# Patient Record
Sex: Male | Born: 1960
Health system: Southern US, Community
[De-identification: ages and names within clinical notes are randomized; demographics above are authoritative.]

## PROBLEM LIST (undated history)

## (undated) DIAGNOSIS — F909 Attention-deficit hyperactivity disorder, unspecified type: Secondary | ICD-10-CM

## (undated) DIAGNOSIS — E785 Hyperlipidemia, unspecified: Secondary | ICD-10-CM

## (undated) DIAGNOSIS — I10 Essential (primary) hypertension: Secondary | ICD-10-CM

## (undated) DIAGNOSIS — M1712 Unilateral primary osteoarthritis, left knee: Secondary | ICD-10-CM

## (undated) DIAGNOSIS — T7840XA Allergy, unspecified, initial encounter: Secondary | ICD-10-CM

## (undated) DIAGNOSIS — Z87442 Personal history of urinary calculi: Secondary | ICD-10-CM

## (undated) DIAGNOSIS — C801 Malignant (primary) neoplasm, unspecified: Secondary | ICD-10-CM

## (undated) DIAGNOSIS — M1990 Unspecified osteoarthritis, unspecified site: Secondary | ICD-10-CM

## (undated) HISTORY — PX: CYST REMOVAL NECK: SHX6281

## (undated) HISTORY — PX: POLYPECTOMY: SHX149

## (undated) HISTORY — DX: Hyperlipidemia, unspecified: E78.5

## (undated) HISTORY — DX: Attention-deficit hyperactivity disorder, unspecified type: F90.9

## (undated) HISTORY — PX: COLONOSCOPY: SHX174

## (undated) HISTORY — DX: Allergy, unspecified, initial encounter: T78.40XA

## (undated) HISTORY — DX: Essential (primary) hypertension: I10

---

## 1985-09-03 HISTORY — PX: OTHER SURGICAL HISTORY: SHX169

## 2004-02-12 ENCOUNTER — Emergency Department (HOSPITAL_COMMUNITY): Admission: EM | Admit: 2004-02-12 | Discharge: 2004-02-12 | Payer: Self-pay | Admitting: Emergency Medicine

## 2004-09-06 ENCOUNTER — Emergency Department (HOSPITAL_COMMUNITY): Admission: EM | Admit: 2004-09-06 | Discharge: 2004-09-06 | Payer: Self-pay | Admitting: Emergency Medicine

## 2008-10-22 ENCOUNTER — Emergency Department (HOSPITAL_COMMUNITY): Admission: EM | Admit: 2008-10-22 | Discharge: 2008-10-23 | Payer: Self-pay | Admitting: Emergency Medicine

## 2008-10-26 ENCOUNTER — Encounter (INDEPENDENT_AMBULATORY_CARE_PROVIDER_SITE_OTHER): Payer: Self-pay | Admitting: *Deleted

## 2008-12-13 ENCOUNTER — Ambulatory Visit: Payer: Self-pay | Admitting: Internal Medicine

## 2008-12-23 ENCOUNTER — Encounter: Payer: Self-pay | Admitting: Internal Medicine

## 2008-12-23 ENCOUNTER — Ambulatory Visit: Payer: Self-pay | Admitting: Internal Medicine

## 2008-12-27 ENCOUNTER — Encounter: Payer: Self-pay | Admitting: Internal Medicine

## 2010-10-03 NOTE — Letter (Signed)
Summary: Pre Visit No Show Letter  [facility]  [address] [place], [place] [address]   Phone: [phone]  Fax: [phone]        October 26, 2008 MRN: [id number]    Bradley Baker [address] [place], [place]  [address]    Dear Mr. Vary,   We have been unable to reach you by phone concerning the pre-procedure visit that you missed on 10-26-08. For this reason,your procedure scheduled on 11-09-08 has been cancelled. Our scheduling staff will gladly assist you with rescheduling your appointments at a more convenient time. Please call our office at [phone] between the hours of 8:00am and 5:00pm, press option #2 to reach an appointment scheduler. Please consider updating your contact numbers at this time so that we can reach you by phone in the future with schedule changes or results.    Thank you,    Ezra Sites RN [facility]

## 2010-12-19 LAB — CBC
HCT: 43.8 % (ref 39.0–52.0)
Hemoglobin: 14.9 g/dL (ref 13.0–17.0)
MCHC: 34 g/dL (ref 30.0–36.0)
MCV: 88.7 fL (ref 78.0–100.0)
RBC: 4.93 MIL/uL (ref 4.22–5.81)
WBC: 11.8 10*3/uL — ABNORMAL HIGH (ref 4.0–10.5)

## 2010-12-19 LAB — URINALYSIS, ROUTINE W REFLEX MICROSCOPIC
Bilirubin Urine: NEGATIVE
Glucose, UA: NEGATIVE mg/dL
Protein, ur: NEGATIVE mg/dL

## 2010-12-19 LAB — BASIC METABOLIC PANEL
CO2: 24 mEq/L (ref 19–32)
Chloride: 103 mEq/L (ref 96–112)
Creatinine, Ser: 0.91 mg/dL (ref 0.4–1.5)
GFR calc Af Amer: >60 mL/min (ref >60)
Potassium: 3.6 mEq/L (ref 3.5–5.1)
Sodium: 137 mEq/L (ref 135–145)

## 2010-12-19 LAB — URINE CULTURE

## 2010-12-19 LAB — URINE MICROSCOPIC-ADD ON

## 2010-12-19 LAB — DIFFERENTIAL
Eosinophils Absolute: 0.1 10*3/uL (ref 0.0–0.7)
Lymphs Abs: 1 10*3/uL (ref 0.7–4.0)
Monocytes Absolute: 0.2 10*3/uL (ref 0.1–1.0)
Monocytes Relative: 2 % — ABNORMAL LOW (ref 3–12)
Neutrophils Relative %: 89 % — ABNORMAL HIGH (ref 43–77)

## 2011-12-18 ENCOUNTER — Encounter: Payer: Self-pay | Admitting: Internal Medicine

## 2012-12-09 ENCOUNTER — Encounter: Payer: Self-pay | Admitting: Internal Medicine

## 2013-04-17 ENCOUNTER — Encounter: Payer: Self-pay | Admitting: Internal Medicine

## 2013-07-06 ENCOUNTER — Ambulatory Visit (AMBULATORY_SURGERY_CENTER): Payer: Self-pay

## 2013-07-06 VITALS — Ht 75.0 in | Wt 209.0 lb

## 2013-07-06 DIAGNOSIS — Z860100 Personal history of colon polyps, unspecified: Secondary | ICD-10-CM

## 2013-07-06 MED ORDER — MOVIPREP 100 G PO SOLR: 1.0000 | Freq: Once | ORAL | Status: DC

## 2013-07-07 ENCOUNTER — Encounter: Payer: Self-pay | Admitting: Internal Medicine

## 2013-07-09 ENCOUNTER — Encounter: Payer: Self-pay | Admitting: Internal Medicine

## 2013-07-09 ENCOUNTER — Ambulatory Visit (AMBULATORY_SURGERY_CENTER): Payer: BC Managed Care – PPO | Admitting: Internal Medicine

## 2013-07-09 VITALS — BP 147/89 | HR 65 | Temp 97.8°F | Resp 24 | Ht 75.0 in | Wt 209.0 lb

## 2013-07-09 DIAGNOSIS — Z86010 Personal history of colonic polyps: Secondary | ICD-10-CM

## 2013-07-09 DIAGNOSIS — D126 Benign neoplasm of colon, unspecified: Secondary | ICD-10-CM

## 2013-07-09 MED ORDER — SODIUM CHLORIDE 0.9 % IV SOLN: 500.0000 mL | INTRAVENOUS | Status: DC

## 2013-07-09 NOTE — Patient Instructions (Signed)
YOU HAD AN ENDOSCOPIC PROCEDURE TODAY AT THE [facility]: Refer to the procedure report that was given to you for any specific questions about what was found during the examination.  If the procedure report does not answer your questions, please call your gastroenterologist to clarify.  If you requested that your care partner not be given the details of your procedure findings, then the procedure report has been included in a sealed envelope for you to review at your convenience later.  YOU SHOULD EXPECT: Some feelings of bloating in the abdomen. Passage of more gas than usual.  Walking can help get rid of the air that was put into your GI tract during the procedure and reduce the bloating. If you had a lower endoscopy (such as a colonoscopy or flexible sigmoidoscopy) you may notice spotting of blood in your stool or on the toilet paper. If you underwent a bowel prep for your procedure, then you may not have a normal bowel movement for a few days.  DIET: Your first meal following the procedure should be a light meal and then it is ok to progress to your normal diet.  A half-sandwich or bowl of soup is an example of a good first meal.  Heavy or fried foods are harder to digest and may make you feel nauseous or bloated.  Likewise meals heavy in dairy and vegetables can cause extra gas to form and this can also increase the bloating.  Drink plenty of fluids but you should avoid alcoholic beverages for 24 hours.  ACTIVITY: Your care partner should take you home directly after the procedure.  You should plan to take it easy, moving slowly for the rest of the day.  You can resume normal activity the day after the procedure however you should NOT DRIVE or use heavy machinery for 24 hours (because of the sedation medicines used during the test).    SYMPTOMS TO REPORT IMMEDIATELY: A gastroenterologist can be reached at any hour.  During normal business hours, 8:30 AM to 5:00 PM Monday through Friday,  call [phone].  After hours and on weekends, please call the GI answering service at [phone] who will take a message and have the physician on call contact you.   Following lower endoscopy (colonoscopy or flexible sigmoidoscopy):  Excessive amounts of blood in the stool  Significant tenderness or worsening of abdominal pains  Swelling of the abdomen that is new, acute  Fever of 100F or higher   FOLLOW UP: If any biopsies were taken you will be contacted by phone or by letter within the next 1-3 weeks.  Call your gastroenterologist if you have not heard about the biopsies in 3 weeks.  Our staff will call the home number listed on your records the next business day following your procedure to check on you and address any questions or concerns that you may have at that time regarding the information given to you following your procedure. This is a courtesy call and so if there is no answer at the home number and we have not heard from you through the emergency physician on call, we will assume that you have returned to your regular daily activities without incident.  SIGNATURES/CONFIDENTIALITY: You and/or your care partner have signed paperwork which will be entered into your electronic medical record.  These signatures attest to the fact that that the information above on your After Visit Summary has been reviewed and is understood.  Full responsibility of the confidentiality of   this discharge information lies with you and/or your care-partner.  Polyp information given.  Next colonoscopy 5 years-2019 

## 2013-07-09 NOTE — Progress Notes (Signed)
Patient did not experience any of the following events: a burn prior to discharge; a fall within the facility; wrong site/side/patient/procedure/implant event; or a hospital transfer or hospital admission upon discharge from the facility. (G8907) Patient did not have preoperative order for IV antibiotic SSI prophylaxis. (G8918)  

## 2013-07-09 NOTE — Op Note (Signed)
[facility] [address] [place], [address]   COLONOSCOPY PROCEDURE REPORT  PATIENT: Bradley Baker, Bradley Baker  MR#: [id number] BIRTHDATE: 1961-05-27 , 52  yrs. old GENDER: Male ENDOSCOPIST: Roxy Cedar, MD REFERRED HQ:[id number] Program Recall PROCEDURE DATE:  07/09/2013 PROCEDURE:   Colonoscopy with snare polypectomy x 2 First Screening Colonoscopy - Avg.  risk and is 50 yrs.  old or older - No.  Prior Negative Screening - Now for repeat screening. N/A  History of Adenoma - Now for follow-up colonoscopy & has been > or = to 3 yrs.  Yes hx of adenoma.  Has been 3 or more years since last colonoscopy.  Polyps Removed Today? Yes. ASA CLASS:   Class II INDICATIONS:Patient's personal history of adenomatous colon polyps. Index April 2000 and with multiple advanced adenomas. MEDICATIONS: MAC sedation, administered by CRNA and propofol (Diprivan) 500mg  IV  DESCRIPTION OF PROCEDURE:   After the risks benefits and alternatives of the procedure were thoroughly explained, informed consent was obtained.  A digital rectal exam revealed no abnormalities of the rectum.   The LB [id number]-H190 [id number]  endoscope was introduced through the anus and advanced to the cecum, which was identified by both the appendix and ileocecal valve. No adverse events experienced.   The quality of the prep was excellent, using MoviPrep  The instrument was then slowly withdrawn as the colon was fully examined.  COLON FINDINGS: Two diminutive polyps were found in the transverse colon and sigmoid colon.  A polypectomy was performed with a cold snare.  The resection was complete and the polyp tissue was completely retrieved.   The colon mucosa was otherwise normal. Retroflexed views revealed internal hemorrhoids. The time to cecum=2 minutes 36 seconds.  Withdrawal time=14 minutes 12 seconds. The scope was withdrawn and the procedure completed. COMPLICATIONS: There were no complications.  ENDOSCOPIC  IMPRESSION: 1.   Two diminutive polyps were found in the colon; polypectomy was performed with a cold snare 2.   The colon mucosa was otherwise normal  RECOMMENDATIONS: 1. Follow up colonoscopy in 5 years   eSigned:  Roxy Cedar, MD 07/09/2013 9:56 AM   cc: Geoffry Paradise, MD and The Patient

## 2013-07-09 NOTE — Progress Notes (Signed)
Called to room to assist during endoscopic procedure.  Patient ID and intended procedure confirmed with present staff. Received instructions for my participation in the procedure from the performing physician. ewm 

## 2013-07-10 ENCOUNTER — Telehealth: Payer: Self-pay | Admitting: *Deleted

## 2013-07-10 NOTE — Telephone Encounter (Signed)
  Follow up Call-  Call back number 07/09/2013  Post procedure Call Back phone  # [phone]  Permission to leave phone message Yes     Patient questions:  Do you have a fever, pain , or abdominal swelling? no Pain Score  0 *  Have you tolerated food without any problems? yes  Have you been able to return to your normal activities? yes  Do you have any questions about your discharge instructions: Diet   no Medications  no Follow up visit  no  Do you have questions or concerns about your Care? no  Actions: * If pain score is 4 or above: No action needed, pain <4.

## 2013-07-15 ENCOUNTER — Encounter: Payer: Self-pay | Admitting: Internal Medicine

## 2016-12-17 DIAGNOSIS — Z125 Encounter for screening for malignant neoplasm of prostate: Secondary | ICD-10-CM | POA: Diagnosis not present

## 2016-12-17 DIAGNOSIS — E784 Other hyperlipidemia: Secondary | ICD-10-CM | POA: Diagnosis not present

## 2016-12-24 DIAGNOSIS — Z1389 Encounter for screening for other disorder: Secondary | ICD-10-CM | POA: Diagnosis not present

## 2016-12-24 DIAGNOSIS — I10 Essential (primary) hypertension: Secondary | ICD-10-CM | POA: Diagnosis not present

## 2016-12-24 DIAGNOSIS — M1990 Unspecified osteoarthritis, unspecified site: Secondary | ICD-10-CM | POA: Diagnosis not present

## 2016-12-24 DIAGNOSIS — J309 Allergic rhinitis, unspecified: Secondary | ICD-10-CM | POA: Diagnosis not present

## 2016-12-24 DIAGNOSIS — Z0000 Encounter for general adult medical examination without abnormal findings: Secondary | ICD-10-CM | POA: Diagnosis not present

## 2016-12-27 DIAGNOSIS — M659 Synovitis and tenosynovitis, unspecified: Secondary | ICD-10-CM | POA: Diagnosis not present

## 2016-12-27 DIAGNOSIS — M25532 Pain in left wrist: Secondary | ICD-10-CM | POA: Diagnosis not present

## 2017-01-24 DIAGNOSIS — M25532 Pain in left wrist: Secondary | ICD-10-CM | POA: Diagnosis not present

## 2017-01-24 DIAGNOSIS — M6588 Other synovitis and tenosynovitis, other site: Secondary | ICD-10-CM | POA: Diagnosis not present

## 2017-03-30 DIAGNOSIS — M1711 Unilateral primary osteoarthritis, right knee: Secondary | ICD-10-CM | POA: Diagnosis not present

## 2017-04-17 DIAGNOSIS — M1711 Unilateral primary osteoarthritis, right knee: Secondary | ICD-10-CM | POA: Diagnosis not present

## 2017-04-29 DIAGNOSIS — M1711 Unilateral primary osteoarthritis, right knee: Secondary | ICD-10-CM | POA: Diagnosis not present

## 2017-05-21 DIAGNOSIS — Z08 Encounter for follow-up examination after completed treatment for malignant neoplasm: Secondary | ICD-10-CM | POA: Diagnosis not present

## 2017-05-21 DIAGNOSIS — Z85820 Personal history of malignant melanoma of skin: Secondary | ICD-10-CM | POA: Diagnosis not present

## 2017-05-21 DIAGNOSIS — Z1283 Encounter for screening for malignant neoplasm of skin: Secondary | ICD-10-CM | POA: Diagnosis not present

## 2017-05-23 DIAGNOSIS — M659 Synovitis and tenosynovitis, unspecified: Secondary | ICD-10-CM | POA: Diagnosis not present

## 2017-06-06 DIAGNOSIS — M6588 Other synovitis and tenosynovitis, other site: Secondary | ICD-10-CM | POA: Diagnosis not present

## 2017-06-07 ENCOUNTER — Other Ambulatory Visit: Payer: Self-pay | Admitting: Orthopedic Surgery

## 2017-06-07 DIAGNOSIS — M659 Synovitis and tenosynovitis, unspecified: Secondary | ICD-10-CM

## 2017-07-04 HISTORY — PX: WRIST SURGERY: SHX841

## 2017-07-05 DIAGNOSIS — G8918 Other acute postprocedural pain: Secondary | ICD-10-CM | POA: Diagnosis not present

## 2017-07-05 DIAGNOSIS — M65832 Other synovitis and tenosynovitis, left forearm: Secondary | ICD-10-CM | POA: Diagnosis not present

## 2017-07-05 DIAGNOSIS — M79632 Pain in left forearm: Secondary | ICD-10-CM | POA: Diagnosis not present

## 2017-07-05 DIAGNOSIS — M65842 Other synovitis and tenosynovitis, left hand: Secondary | ICD-10-CM | POA: Diagnosis not present

## 2017-07-05 DIAGNOSIS — M25332 Other instability, left wrist: Secondary | ICD-10-CM | POA: Diagnosis not present

## 2017-07-05 DIAGNOSIS — M24132 Other articular cartilage disorders, left wrist: Secondary | ICD-10-CM | POA: Diagnosis not present

## 2017-07-05 DIAGNOSIS — G5622 Lesion of ulnar nerve, left upper limb: Secondary | ICD-10-CM | POA: Diagnosis not present

## 2017-07-05 DIAGNOSIS — M66232 Spontaneous rupture of extensor tendons, left forearm: Secondary | ICD-10-CM | POA: Diagnosis not present

## 2017-07-15 DIAGNOSIS — M1711 Unilateral primary osteoarthritis, right knee: Secondary | ICD-10-CM | POA: Diagnosis not present

## 2017-07-15 DIAGNOSIS — M1712 Unilateral primary osteoarthritis, left knee: Secondary | ICD-10-CM | POA: Diagnosis not present

## 2017-07-19 DIAGNOSIS — M65842 Other synovitis and tenosynovitis, left hand: Secondary | ICD-10-CM | POA: Diagnosis not present

## 2017-07-22 DIAGNOSIS — E7849 Other hyperlipidemia: Secondary | ICD-10-CM | POA: Diagnosis not present

## 2017-07-22 DIAGNOSIS — M1991 Primary osteoarthritis, unspecified site: Secondary | ICD-10-CM | POA: Diagnosis not present

## 2017-07-22 DIAGNOSIS — M1711 Unilateral primary osteoarthritis, right knee: Secondary | ICD-10-CM | POA: Diagnosis not present

## 2017-07-22 DIAGNOSIS — M1712 Unilateral primary osteoarthritis, left knee: Secondary | ICD-10-CM | POA: Diagnosis not present

## 2017-07-22 DIAGNOSIS — I10 Essential (primary) hypertension: Secondary | ICD-10-CM | POA: Diagnosis not present

## 2017-07-23 ENCOUNTER — Inpatient Hospital Stay: Admit: 2017-07-23 | Payer: Self-pay | Admitting: Orthopaedic Surgery

## 2017-07-23 SURGERY — ARTHROPLASTY, KNEE, TOTAL
Anesthesia: Spinal | Laterality: Left

## 2017-07-29 DIAGNOSIS — M1712 Unilateral primary osteoarthritis, left knee: Secondary | ICD-10-CM | POA: Diagnosis not present

## 2017-07-29 DIAGNOSIS — M1711 Unilateral primary osteoarthritis, right knee: Secondary | ICD-10-CM | POA: Diagnosis not present

## 2017-08-02 DIAGNOSIS — M25532 Pain in left wrist: Secondary | ICD-10-CM | POA: Diagnosis not present

## 2017-08-07 DIAGNOSIS — M25532 Pain in left wrist: Secondary | ICD-10-CM | POA: Diagnosis not present

## 2017-08-16 DIAGNOSIS — M25532 Pain in left wrist: Secondary | ICD-10-CM | POA: Diagnosis not present

## 2017-08-22 DIAGNOSIS — M25532 Pain in left wrist: Secondary | ICD-10-CM | POA: Diagnosis not present

## 2017-09-06 DIAGNOSIS — M25632 Stiffness of left wrist, not elsewhere classified: Secondary | ICD-10-CM | POA: Diagnosis not present

## 2017-09-10 DIAGNOSIS — M25632 Stiffness of left wrist, not elsewhere classified: Secondary | ICD-10-CM | POA: Diagnosis not present

## 2017-09-13 DIAGNOSIS — M25632 Stiffness of left wrist, not elsewhere classified: Secondary | ICD-10-CM | POA: Diagnosis not present

## 2017-09-18 DIAGNOSIS — M25632 Stiffness of left wrist, not elsewhere classified: Secondary | ICD-10-CM | POA: Diagnosis not present

## 2017-10-04 DIAGNOSIS — M25632 Stiffness of left wrist, not elsewhere classified: Secondary | ICD-10-CM | POA: Diagnosis not present

## 2017-10-11 DIAGNOSIS — M25632 Stiffness of left wrist, not elsewhere classified: Secondary | ICD-10-CM | POA: Diagnosis not present

## 2017-10-16 DIAGNOSIS — M25632 Stiffness of left wrist, not elsewhere classified: Secondary | ICD-10-CM | POA: Diagnosis not present

## 2017-10-31 ENCOUNTER — Other Ambulatory Visit: Payer: Self-pay | Admitting: Orthopaedic Surgery

## 2017-11-01 NOTE — Pre-Procedure Instructions (Signed)
Nikkolas Coomes  11/01/2017      [place], [place] [address] Phone: [phone] Fax: [phone]    Your procedure is scheduled on March 12  Report to [facility] at 1040 A.M.  Call this number if you have problems the morning of surgery:  [phone]   Remember:  Do not eat food or drink liquids after midnight.  Continue all medications as directed by your physician except follow these medication instructions before surgery below   Take these medicines the morning of surgery with A SIP OF WATER  finasteride (PROPECIA)  7 days prior to surgery STOP taking any Aspirin(unless otherwise instructed by your surgeon), Aleve, Naproxen, Ibuprofen, Motrin, Advil, Goody's, BC's, all herbal medications, fish oil, and all vitamins    Do not wear jewelry  Do not wear lotions, powders, or cologne, or deodorant.  Men may shave face and neck.  Do not bring valuables to the hospital.  [facility] is not responsible for any belongings or valuables.  Contacts, dentures or bridgework may not be worn into surgery.  Leave your suitcase in the car.  After surgery it may be brought to your room.  For patients admitted to the hospital, discharge time will be determined by your treatment team.  Patients discharged the day of surgery will not be allowed to drive home.    Special instructions:   [facility]- Preparing For Surgery  Before surgery, you can play an important role. Because skin is not sterile, your skin needs to be as free of germs as possible. You can reduce the number of germs on your skin by washing with CHG (chlorahexidine gluconate) Soap before surgery.  CHG is an antiseptic cleaner which kills germs and bonds with the skin to continue killing germs even after washing.  Please do not use if you have an allergy to CHG or antibacterial soaps. If your skin becomes  reddened/irritated stop using the CHG.  Do not shave (including legs and underarms) for at least 48 hours prior to first CHG shower. It is OK to shave your face.  Please follow these instructions carefully.   1. Shower the NIGHT BEFORE SURGERY and the MORNING OF SURGERY with CHG.   2. If you chose to wash your hair, wash your hair first as usual with your normal shampoo.  3. After you shampoo, rinse your hair and body thoroughly to remove the shampoo.  4. Use CHG as you would any other liquid soap. You can apply CHG directly to the skin and wash gently with a scrungie or a clean washcloth.   5. Apply the CHG Soap to your body ONLY FROM THE NECK DOWN.  Do not use on open wounds or open sores. Avoid contact with your eyes, ears, mouth and genitals (private parts). Wash Face and genitals (private parts)  with your normal soap.  6. Wash thoroughly, paying special attention to the area where your surgery will be performed.  7. Thoroughly rinse your body with warm water from the neck down.  8. DO NOT shower/wash with your normal soap after using and rinsing off the CHG Soap.  9. Pat yourself dry with a CLEAN TOWEL.  10. Wear CLEAN PAJAMAS to bed the night before surgery, wear comfortable clothes the morning of surgery  11. Place CLEAN SHEETS on your bed the night of your first shower and DO NOT SLEEP WITH PETS.  Day of Surgery: Do not apply any deodorants/lotions. Please wear clean clothes to the hospital/surgery center.      Please read over the following fact sheets that you were given.

## 2017-11-04 ENCOUNTER — Ambulatory Visit (HOSPITAL_COMMUNITY)
Admission: RE | Admit: 2017-11-04 | Discharge: 2017-11-04 | Disposition: A | Discharge status: Home / Self Care | Payer: 59 | Source: Ambulatory Visit | Attending: Orthopaedic Surgery | Admitting: Orthopaedic Surgery

## 2017-11-04 ENCOUNTER — Encounter (HOSPITAL_COMMUNITY): Payer: Self-pay

## 2017-11-04 ENCOUNTER — Other Ambulatory Visit: Payer: Self-pay

## 2017-11-04 ENCOUNTER — Encounter (HOSPITAL_COMMUNITY)
Admission: RE | Admit: 2017-11-04 | Discharge: 2017-11-04 | Disposition: A | Discharge status: Home / Self Care | Payer: 59 | Source: Ambulatory Visit | Attending: Orthopaedic Surgery | Admitting: Orthopaedic Surgery

## 2017-11-04 DIAGNOSIS — Z0183 Encounter for blood typing: Secondary | ICD-10-CM | POA: Diagnosis not present

## 2017-11-04 DIAGNOSIS — Z01818 Encounter for other preprocedural examination: Secondary | ICD-10-CM | POA: Diagnosis not present

## 2017-11-04 DIAGNOSIS — Z01812 Encounter for preprocedural laboratory examination: Secondary | ICD-10-CM | POA: Diagnosis not present

## 2017-11-04 HISTORY — DX: Personal history of urinary calculi: Z87.442

## 2017-11-04 HISTORY — DX: Malignant (primary) neoplasm, unspecified: C80.1

## 2017-11-04 HISTORY — DX: Unspecified osteoarthritis, unspecified site: M19.90

## 2017-11-04 LAB — BASIC METABOLIC PANEL
Anion gap: 11 (ref 5–15)
BUN: 16 mg/dL (ref 6–20)
CHLORIDE: 106 mmol/L (ref 101–111)
CO2: 23 mmol/L (ref 22–32)
Calcium: 8.8 mg/dL — ABNORMAL LOW (ref 8.9–10.3)
Creatinine, Ser: 0.8 mg/dL (ref 0.61–1.24)
GFR calc Af Amer: >60 mL/min (ref >60)
GFR calc non Af Amer: >60 mL/min (ref >60)
GLUCOSE: 110 mg/dL — AB (ref 65–99)
POTASSIUM: 3.8 mmol/L (ref 3.5–5.1)
Sodium: 140 mmol/L (ref 135–145)

## 2017-11-04 LAB — CBC WITH DIFFERENTIAL/PLATELET
Basophils Absolute: 0 10*3/uL (ref 0.0–0.1)
Basophils Relative: 1 %
EOS PCT: 7 %
Eosinophils Absolute: 0.5 10*3/uL (ref 0.0–0.7)
HCT: 45.3 % (ref 39.0–52.0)
Hemoglobin: 15 g/dL (ref 13.0–17.0)
LYMPHS ABS: 2.2 10*3/uL (ref 0.7–4.0)
LYMPHS PCT: 36 %
MCH: 30.7 pg (ref 26.0–34.0)
MCHC: 33.1 g/dL (ref 30.0–36.0)
MCV: 92.6 fL (ref 78.0–100.0)
MONO ABS: 0.6 10*3/uL (ref 0.1–1.0)
MONOS PCT: 9 %
Neutro Abs: 3 10*3/uL (ref 1.7–7.7)
Neutrophils Relative %: 47 %
Platelets: 177 10*3/uL (ref 150–400)
RBC: 4.89 MIL/uL (ref 4.22–5.81)
RDW: 13.4 % (ref 11.5–15.5)
WBC: 6.3 10*3/uL (ref 4.0–10.5)

## 2017-11-04 LAB — URINALYSIS, ROUTINE W REFLEX MICROSCOPIC
BILIRUBIN URINE: NEGATIVE
Glucose, UA: NEGATIVE mg/dL
Hgb urine dipstick: NEGATIVE
Ketones, ur: NEGATIVE mg/dL
Leukocytes, UA: NEGATIVE
NITRITE: NEGATIVE
PROTEIN: NEGATIVE mg/dL
SPECIFIC GRAVITY, URINE: 1.015 (ref 1.005–1.030)
pH: 6 (ref 5.0–8.0)

## 2017-11-04 LAB — APTT: APTT: 30 s (ref 24–36)

## 2017-11-04 LAB — TYPE AND SCREEN
ABO/RH(D): O POS
Antibody Screen: NEGATIVE

## 2017-11-04 LAB — SURGICAL PCR SCREEN
MRSA, PCR: NEGATIVE
Staphylococcus aureus: NEGATIVE

## 2017-11-04 LAB — PROTIME-INR
INR: 1.11
Prothrombin Time: 14.2 seconds (ref 11.4–15.2)

## 2017-11-04 LAB — ABO/RH: ABO/RH(D): O POS

## 2017-11-04 NOTE — Progress Notes (Signed)
   11/04/17 0833  OBSTRUCTIVE SLEEP APNEA  Score 5 or greater  Results sent to PCP

## 2017-11-04 NOTE — Progress Notes (Signed)
PCP: Dr. Burnard Bunting  No cardiologist

## 2017-11-06 NOTE — H&P (Signed)
TOTAL KNEE ADMISSION H&P  Patient is being admitted for left total knee arthroplasty.  Subjective:  Chief Complaint:left knee pain.  HPI: Bradley Baker, 57 y.o. male, has a history of pain and functional disability in the left knee due to arthritis and has failed non-surgical conservative treatments for greater than 12 weeks to includeNSAID's and/or analgesics, corticosteriod injections, viscosupplementation injections, flexibility and strengthening excercises, supervised PT with diminished ADL's post treatment, weight reduction as appropriate and activity modification.  Onset of symptoms was gradual, starting 5 years ago with gradually worsening course since that time. The patient noted no past surgery on the left knee(s).  Patient currently rates pain in the left knee(s) at 10 out of 10 with activity. Patient has night pain, worsening of pain with activity and weight bearing, pain that interferes with activities of daily living, crepitus and joint swelling.  Patient has evidence of subchondral cysts, subchondral sclerosis, periarticular osteophytes and joint space narrowing by imaging studies. There is no active infection.  There are no active problems to display for this patient.  Past Medical History:  Diagnosis Date  . ADHD (attention deficit hyperactivity disorder)   . Arthritis   . Cancer ([place])    skin cancers  . History of kidney stones   . Hypertension     Past Surgical History:  Procedure Laterality Date  . CYST REMOVAL NECK    . eye injury and loss  1987  . WRIST SURGERY Left 07/2017   temdon surgery    No current facility-administered medications for this encounter.    Current Outpatient Medications  Medication Sig Dispense Refill Last Dose  . atorvastatin (LIPITOR) 40 MG tablet Take 20 mg by mouth daily.     Marland Kitchen doxylamine, Sleep, (UNISOM) 25 MG tablet Take 25 mg by mouth at bedtime as needed for sleep.     . finasteride (PROPECIA) 1 MG tablet Take 0.25 mg by mouth daily.      Marland Kitchen ibuprofen (ADVIL,MOTRIN) 200 MG tablet Take 400-800 mg by mouth daily as needed for moderate pain.     Marland Kitchen losartan (COZAAR) 100 MG tablet Take 100 mg by mouth daily.      Allergies  Allergen Reactions  . Penicillins Hives and Nausea And Vomiting    Has patient had a PCN reaction causing immediate rash, facial/tongue/throat swelling, SOB or lightheadedness with hypotension: Unknown Has patient had a PCN reaction causing severe rash involving mucus membranes or skin necrosis: Yes Has patient had a PCN reaction that required hospitalization: No Has patient had a PCN reaction occurring within the last 10 years: No If all of the above answers are "NO", then may proceed with Cephalosporin use.     Social History   Tobacco Use  . Smoking status: Former Smoker    Last attempt to quit: 07/07/1999    Years since quitting: 18.3  . Smokeless tobacco: Former Network engineer Use Topics  . Alcohol use: Yes    Alcohol/week: 2.4 - 3.6 oz    Types: 4 - 6 Cans of beer per week    Family History  Problem Relation Age of Onset  . Colon cancer Neg Hx   . Pancreatic cancer Neg Hx   . Stomach cancer Neg Hx      Review of Systems  Musculoskeletal: Positive for joint pain.       Left knee  All other systems reviewed and are negative.   Objective:  Physical Exam  Vital signs in last 24 hours:  Labs:   Estimated body mass index is 27.51 kg/m as calculated from the following:   Height as of 11/04/17: 6\' 3"  (1.905 m).   Weight as of 11/04/17: 99.8 kg (220 lb 1.6 oz).   Imaging Review Plain radiographs demonstrate severe degenerative joint disease of the left knee(s). The overall alignment isneutral. The bone quality appears to be good for age and reported activity level.  Assessment/Plan:  End stage primary arthritis, left knee   The patient history, physical examination, clinical judgment of the provider and imaging studies are consistent with end stage degenerative joint disease of  the left knee(s) and total knee arthroplasty is deemed medically necessary. The treatment options including medical management, injection therapy arthroscopy and arthroplasty were discussed at length. The risks and benefits of total knee arthroplasty were presented and reviewed. The risks due to aseptic loosening, infection, stiffness, patella tracking problems, thromboembolic complications and other imponderables were discussed. The patient acknowledged the explanation, agreed to proceed with the plan and consent was signed. Patient is being admitted for inpatient treatment for surgery, pain control, PT, OT, prophylactic antibiotics, VTE prophylaxis, progressive ambulation and ADL's and discharge planning. The patient is planning to be discharged home with home health services

## 2017-11-11 MED ORDER — SODIUM CHLORIDE 0.9 % IV SOLN
1000.0000 mg | INTRAVENOUS | Status: AC
  Administered 2017-11-12: 1000 mg via INTRAVENOUS
  Filled 2017-11-11: qty 1100

## 2017-11-11 MED ORDER — TRANEXAMIC ACID 1000 MG/10ML IV SOLN
2000.0000 mg | INTRAVENOUS | Status: AC
  Administered 2017-11-12: 2000 mg via TOPICAL
  Filled 2017-11-11: qty 20

## 2017-11-11 MED ORDER — VANCOMYCIN HCL 10 G IV SOLR
1500.0000 mg | INTRAVENOUS | Status: AC
  Administered 2017-11-12: 1500 mg via INTRAVENOUS
  Filled 2017-11-11: qty 1500

## 2017-11-12 ENCOUNTER — Ambulatory Visit (HOSPITAL_COMMUNITY)
Admission: RE | Admit: 2017-11-12 | Discharge: 2017-11-13 | Disposition: A | Discharge status: Home / Self Care | Payer: 59 | Source: Ambulatory Visit | Attending: Orthopaedic Surgery | Admitting: Orthopaedic Surgery

## 2017-11-12 ENCOUNTER — Other Ambulatory Visit: Payer: Self-pay

## 2017-11-12 ENCOUNTER — Inpatient Hospital Stay (HOSPITAL_COMMUNITY): Payer: 59 | Admitting: Certified Registered Nurse Anesthetist

## 2017-11-12 ENCOUNTER — Encounter (HOSPITAL_COMMUNITY): Payer: Self-pay

## 2017-11-12 ENCOUNTER — Encounter (HOSPITAL_COMMUNITY)
Admission: RE | Disposition: A | Discharge status: Home / Self Care | Payer: Self-pay | Source: Ambulatory Visit | Attending: Orthopaedic Surgery

## 2017-11-12 DIAGNOSIS — Z87891 Personal history of nicotine dependence: Secondary | ICD-10-CM | POA: Insufficient documentation

## 2017-11-12 DIAGNOSIS — G8918 Other acute postprocedural pain: Secondary | ICD-10-CM | POA: Diagnosis not present

## 2017-11-12 DIAGNOSIS — Z79899 Other long term (current) drug therapy: Secondary | ICD-10-CM | POA: Diagnosis not present

## 2017-11-12 DIAGNOSIS — Z87442 Personal history of urinary calculi: Secondary | ICD-10-CM | POA: Diagnosis not present

## 2017-11-12 DIAGNOSIS — I10 Essential (primary) hypertension: Secondary | ICD-10-CM | POA: Insufficient documentation

## 2017-11-12 DIAGNOSIS — M1712 Unilateral primary osteoarthritis, left knee: Principal | ICD-10-CM | POA: Diagnosis present

## 2017-11-12 DIAGNOSIS — Z880 Allergy status to penicillin: Secondary | ICD-10-CM | POA: Diagnosis not present

## 2017-11-12 DIAGNOSIS — Z85828 Personal history of other malignant neoplasm of skin: Secondary | ICD-10-CM | POA: Insufficient documentation

## 2017-11-12 DIAGNOSIS — F909 Attention-deficit hyperactivity disorder, unspecified type: Secondary | ICD-10-CM | POA: Diagnosis not present

## 2017-11-12 HISTORY — PX: TOTAL KNEE ARTHROPLASTY: SHX125

## 2017-11-12 HISTORY — DX: Unilateral primary osteoarthritis, left knee: M17.12

## 2017-11-12 SURGERY — ARTHROPLASTY, KNEE, TOTAL
Anesthesia: Regional | Laterality: Left

## 2017-11-12 MED ORDER — ONDANSETRON HCL 4 MG/2ML IJ SOLN
INTRAMUSCULAR | Status: DC | PRN
  Administered 2017-11-12: 4 mg via INTRAVENOUS

## 2017-11-12 MED ORDER — 0.9 % SODIUM CHLORIDE (POUR BTL) OPTIME
TOPICAL | Status: DC | PRN
  Administered 2017-11-12: 1000 mL

## 2017-11-12 MED ORDER — FENTANYL CITRATE (PF) 250 MCG/5ML IJ SOLN
INTRAMUSCULAR | Status: AC
  Filled 2017-11-12: qty 5

## 2017-11-12 MED ORDER — SODIUM CHLORIDE 0.9% FLUSH
INTRAVENOUS | Status: DC | PRN
  Administered 2017-11-12: 30 mL

## 2017-11-12 MED ORDER — HYDROCODONE-ACETAMINOPHEN 7.5-325 MG PO TABS: 1.0000 | ORAL_TABLET | ORAL | Status: DC | PRN

## 2017-11-12 MED ORDER — BUPIVACAINE LIPOSOME 1.3 % IJ SUSP
INTRAMUSCULAR | Status: DC | PRN
  Administered 2017-11-12: 20 mL

## 2017-11-12 MED ORDER — LIDOCAINE HCL (CARDIAC) 20 MG/ML IV SOLN
INTRAVENOUS | Status: DC | PRN
  Administered 2017-11-12: 50 mg via INTRATRACHEAL

## 2017-11-12 MED ORDER — ONDANSETRON HCL 4 MG/2ML IJ SOLN: 4.0000 mg | Freq: Four times a day (QID) | INTRAMUSCULAR | Status: DC | PRN

## 2017-11-12 MED ORDER — PHENYLEPHRINE HCL 10 MG/ML IJ SOLN
INTRAVENOUS | Status: DC | PRN
  Administered 2017-11-12: 25 ug/min via INTRAVENOUS

## 2017-11-12 MED ORDER — MIDAZOLAM HCL 2 MG/2ML IJ SOLN
INTRAMUSCULAR | Status: AC
  Filled 2017-11-12: qty 2

## 2017-11-12 MED ORDER — ACETAMINOPHEN 500 MG PO TABS
500.0000 mg | ORAL_TABLET | Freq: Four times a day (QID) | ORAL | Status: DC
  Administered 2017-11-13 (×3): 500 mg via ORAL
  Filled 2017-11-12 (×4): qty 1

## 2017-11-12 MED ORDER — FINASTERIDE 1 MG PO TABS: 0.2500 mg | ORAL_TABLET | Freq: Every day | ORAL | Status: DC

## 2017-11-12 MED ORDER — BUPIVACAINE IN DEXTROSE 0.75-8.25 % IT SOLN
INTRATHECAL | Status: DC | PRN
  Administered 2017-11-12: 1.8 mL via INTRATHECAL

## 2017-11-12 MED ORDER — HYDROCODONE-ACETAMINOPHEN 5-325 MG PO TABS
ORAL_TABLET | ORAL | Status: AC
  Filled 2017-11-12: qty 2

## 2017-11-12 MED ORDER — ONDANSETRON HCL 4 MG PO TABS: 4.0000 mg | ORAL_TABLET | Freq: Four times a day (QID) | ORAL | Status: DC | PRN

## 2017-11-12 MED ORDER — DOXYLAMINE SUCCINATE (SLEEP) 25 MG PO TABS
25.0000 mg | ORAL_TABLET | Freq: Every evening | ORAL | Status: DC | PRN
  Filled 2017-11-12: qty 1

## 2017-11-12 MED ORDER — METHOCARBAMOL 500 MG PO TABS
ORAL_TABLET | ORAL | Status: AC
  Filled 2017-11-12: qty 1

## 2017-11-12 MED ORDER — DEXAMETHASONE SODIUM PHOSPHATE 10 MG/ML IJ SOLN
INTRAMUSCULAR | Status: DC | PRN
  Administered 2017-11-12: 5 mg via INTRAVENOUS

## 2017-11-12 MED ORDER — FENTANYL CITRATE (PF) 250 MCG/5ML IJ SOLN
INTRAMUSCULAR | Status: DC | PRN
  Administered 2017-11-12: 50 ug via INTRAVENOUS
  Administered 2017-11-12 (×2): 25 ug via INTRAVENOUS
  Administered 2017-11-12 (×3): 50 ug via INTRAVENOUS

## 2017-11-12 MED ORDER — ONDANSETRON HCL 4 MG/2ML IJ SOLN
INTRAMUSCULAR | Status: AC
  Filled 2017-11-12: qty 2

## 2017-11-12 MED ORDER — KETAMINE HCL 10 MG/ML IJ SOLN
INTRAMUSCULAR | Status: DC | PRN
  Administered 2017-11-12: 15 mg via INTRAVENOUS
  Administered 2017-11-12 (×2): 10 mg via INTRAVENOUS
  Administered 2017-11-12: 15 mg via INTRAVENOUS

## 2017-11-12 MED ORDER — VANCOMYCIN HCL IN DEXTROSE 1-5 GM/200ML-% IV SOLN
1000.0000 mg | Freq: Two times a day (BID) | INTRAVENOUS | Status: AC
  Administered 2017-11-13: 1000 mg via INTRAVENOUS
  Filled 2017-11-12: qty 200

## 2017-11-12 MED ORDER — METHOCARBAMOL 1000 MG/10ML IJ SOLN
500.0000 mg | Freq: Four times a day (QID) | INTRAMUSCULAR | Status: DC | PRN
  Filled 2017-11-12: qty 5

## 2017-11-12 MED ORDER — MIDAZOLAM HCL 2 MG/2ML IJ SOLN
INTRAMUSCULAR | Status: DC | PRN
  Administered 2017-11-12 (×2): 1 mg via INTRAVENOUS

## 2017-11-12 MED ORDER — DOCUSATE SODIUM 100 MG PO CAPS
100.0000 mg | ORAL_CAPSULE | Freq: Two times a day (BID) | ORAL | Status: DC
  Administered 2017-11-12 – 2017-11-13 (×2): 100 mg via ORAL
  Filled 2017-11-12 (×2): qty 1

## 2017-11-12 MED ORDER — LACTATED RINGERS IV SOLN
INTRAVENOUS | Status: DC
  Administered 2017-11-12: 13:00:00 via INTRAVENOUS

## 2017-11-12 MED ORDER — PROPOFOL 1000 MG/100ML IV EMUL
INTRAVENOUS | Status: AC
  Filled 2017-11-12: qty 100

## 2017-11-12 MED ORDER — SODIUM CHLORIDE 0.9 % IV SOLN
1500.0000 mg | INTRAVENOUS | Status: AC
  Administered 2017-11-12: 1500 mg via INTRAVENOUS

## 2017-11-12 MED ORDER — CHLORHEXIDINE GLUCONATE 4 % EX LIQD: 60.0000 mL | Freq: Once | CUTANEOUS | Status: DC

## 2017-11-12 MED ORDER — MIDAZOLAM HCL 2 MG/2ML IJ SOLN
2.0000 mg | Freq: Once | INTRAMUSCULAR | Status: AC
  Administered 2017-11-12: 2 mg via INTRAVENOUS

## 2017-11-12 MED ORDER — FENTANYL CITRATE (PF) 100 MCG/2ML IJ SOLN
INTRAMUSCULAR | Status: AC
  Administered 2017-11-12: 100 ug via INTRAVENOUS
  Filled 2017-11-12: qty 2

## 2017-11-12 MED ORDER — SODIUM CHLORIDE 0.9 % IR SOLN
Status: DC | PRN
  Administered 2017-11-12: 3000 mL

## 2017-11-12 MED ORDER — ATORVASTATIN CALCIUM 20 MG PO TABS
20.0000 mg | ORAL_TABLET | Freq: Every day | ORAL | Status: DC
  Administered 2017-11-12: 20 mg via ORAL
  Filled 2017-11-12: qty 1

## 2017-11-12 MED ORDER — BISACODYL 5 MG PO TBEC: 5.0000 mg | DELAYED_RELEASE_TABLET | Freq: Every day | ORAL | Status: DC | PRN

## 2017-11-12 MED ORDER — LACTATED RINGERS IV SOLN
INTRAVENOUS | Status: DC
  Administered 2017-11-12: 11:00:00 via INTRAVENOUS

## 2017-11-12 MED ORDER — METOPROLOL TARTARATE 1 MG/ML SYRINGE (5ML)
Status: DC | PRN
  Administered 2017-11-12 (×3): 1 mg via INTRAVENOUS

## 2017-11-12 MED ORDER — PROPOFOL 10 MG/ML IV BOLUS
INTRAVENOUS | Status: DC | PRN
  Administered 2017-11-12: 75 mg via INTRAVENOUS
  Administered 2017-11-12: 25 mg via INTRAVENOUS
  Administered 2017-11-12: 50 mg via INTRAVENOUS
  Administered 2017-11-12: 25 mg via INTRAVENOUS
  Administered 2017-11-12: 50 mg via INTRAVENOUS
  Administered 2017-11-12: 25 mg via INTRAVENOUS

## 2017-11-12 MED ORDER — BUPIVACAINE-EPINEPHRINE (PF) 0.5% -1:200000 IJ SOLN
INTRAMUSCULAR | Status: DC | PRN
  Administered 2017-11-12: 30 mL

## 2017-11-12 MED ORDER — LOSARTAN POTASSIUM 50 MG PO TABS
100.0000 mg | ORAL_TABLET | Freq: Every day | ORAL | Status: DC
  Administered 2017-11-12 – 2017-11-13 (×2): 100 mg via ORAL
  Filled 2017-11-12 (×2): qty 2

## 2017-11-12 MED ORDER — PROPOFOL 500 MG/50ML IV EMUL
INTRAVENOUS | Status: DC | PRN
  Administered 2017-11-12: 50 ug/kg/min via INTRAVENOUS

## 2017-11-12 MED ORDER — ACETAMINOPHEN 325 MG PO TABS: 325.0000 mg | ORAL_TABLET | Freq: Four times a day (QID) | ORAL | Status: DC | PRN

## 2017-11-12 MED ORDER — DIPHENHYDRAMINE HCL 12.5 MG/5ML PO ELIX: 12.5000 mg | ORAL_SOLUTION | ORAL | Status: DC | PRN

## 2017-11-12 MED ORDER — METHOCARBAMOL 500 MG PO TABS
500.0000 mg | ORAL_TABLET | Freq: Four times a day (QID) | ORAL | Status: DC | PRN
  Administered 2017-11-12: 500 mg via ORAL

## 2017-11-12 MED ORDER — LACTATED RINGERS IV SOLN
INTRAVENOUS | Status: DC
  Administered 2017-11-12: 22:00:00 via INTRAVENOUS

## 2017-11-12 MED ORDER — HYDROMORPHONE HCL 1 MG/ML IJ SOLN: 0.2500 mg | INTRAMUSCULAR | Status: DC | PRN

## 2017-11-12 MED ORDER — TRANEXAMIC ACID 1000 MG/10ML IV SOLN
1000.0000 mg | Freq: Once | INTRAVENOUS | Status: DC
  Filled 2017-11-12: qty 10

## 2017-11-12 MED ORDER — GLYCOPYRROLATE 0.2 MG/ML IJ SOLN
INTRAMUSCULAR | Status: DC | PRN
  Administered 2017-11-12: 0.1 mg via INTRAVENOUS

## 2017-11-12 MED ORDER — METOCLOPRAMIDE HCL 5 MG PO TABS: 5.0000 mg | ORAL_TABLET | Freq: Three times a day (TID) | ORAL | Status: DC | PRN

## 2017-11-12 MED ORDER — PHENOL 1.4 % MT LIQD: 1.0000 | OROMUCOSAL | Status: DC | PRN

## 2017-11-12 MED ORDER — METOCLOPRAMIDE HCL 5 MG/ML IJ SOLN: 5.0000 mg | Freq: Three times a day (TID) | INTRAMUSCULAR | Status: DC | PRN

## 2017-11-12 MED ORDER — KETAMINE HCL 10 MG/ML IJ SOLN
INTRAMUSCULAR | Status: AC
  Filled 2017-11-12: qty 1

## 2017-11-12 MED ORDER — LIDOCAINE HCL (CARDIAC) 20 MG/ML IV SOLN
INTRAVENOUS | Status: AC
  Filled 2017-11-12: qty 5

## 2017-11-12 MED ORDER — DEXAMETHASONE SODIUM PHOSPHATE 10 MG/ML IJ SOLN
INTRAMUSCULAR | Status: AC
  Filled 2017-11-12: qty 1

## 2017-11-12 MED ORDER — ASPIRIN EC 325 MG PO TBEC
325.0000 mg | DELAYED_RELEASE_TABLET | Freq: Two times a day (BID) | ORAL | Status: DC
  Administered 2017-11-13: 325 mg via ORAL
  Filled 2017-11-12: qty 1

## 2017-11-12 MED ORDER — HYDROCODONE-ACETAMINOPHEN 5-325 MG PO TABS
1.0000 | ORAL_TABLET | ORAL | Status: DC | PRN
  Administered 2017-11-12 – 2017-11-13 (×2): 2 via ORAL
  Filled 2017-11-12: qty 2

## 2017-11-12 MED ORDER — MORPHINE SULFATE (PF) 2 MG/ML IV SOLN: 0.5000 mg | INTRAVENOUS | Status: DC | PRN

## 2017-11-12 MED ORDER — FENTANYL CITRATE (PF) 100 MCG/2ML IJ SOLN: 100.0000 ug | Freq: Once | INTRAMUSCULAR | Status: DC

## 2017-11-12 MED ORDER — MENTHOL 3 MG MT LOZG: 1.0000 | LOZENGE | OROMUCOSAL | Status: DC | PRN

## 2017-11-12 MED ORDER — KETOROLAC TROMETHAMINE 15 MG/ML IJ SOLN
15.0000 mg | Freq: Four times a day (QID) | INTRAMUSCULAR | Status: AC
  Administered 2017-11-12 – 2017-11-13 (×4): 15 mg via INTRAVENOUS
  Filled 2017-11-12 (×3): qty 1

## 2017-11-12 MED ORDER — ALUM & MAG HYDROXIDE-SIMETH 200-200-20 MG/5ML PO SUSP: 30.0000 mL | ORAL | Status: DC | PRN

## 2017-11-12 MED ORDER — PROPOFOL 500 MG/50ML IV EMUL
INTRAVENOUS | Status: AC
  Filled 2017-11-12: qty 50

## 2017-11-12 MED ORDER — BUPIVACAINE LIPOSOME 1.3 % IJ SUSP
20.0000 mL | Freq: Once | INTRAMUSCULAR | Status: DC
  Filled 2017-11-12: qty 20

## 2017-11-12 MED ORDER — KETOROLAC TROMETHAMINE 15 MG/ML IJ SOLN
INTRAMUSCULAR | Status: AC
  Filled 2017-11-12: qty 1

## 2017-11-12 SURGICAL SUPPLY — 54 items
BAG DECANTER FOR FLEXI CONT (MISCELLANEOUS) IMPLANT
BANDAGE ACE 6X5 VEL STRL LF (GAUZE/BANDAGES/DRESSINGS) ×2 IMPLANT
BANDAGE ESMARK 6X9 LF (GAUZE/BANDAGES/DRESSINGS) ×1 IMPLANT
BLADE SAGITTAL 25.0X1.19X90 (BLADE) IMPLANT
BLADE SAW SGTL 13.0X1.19X90.0M (BLADE) IMPLANT
BNDG CMPR 9X6 STRL LF SNTH (GAUZE/BANDAGES/DRESSINGS) ×1
BNDG ELASTIC 6X10 VLCR STRL LF (GAUZE/BANDAGES/DRESSINGS) ×2 IMPLANT
BNDG ESMARK 6X9 LF (GAUZE/BANDAGES/DRESSINGS) ×2
BOWL SMART MIX CTS (DISPOSABLE) ×2 IMPLANT
CAPT KNEE TOTAL 3 ATTUNE ×2 IMPLANT
CEMENT HV SMART SET (Cement) ×4 IMPLANT
COVER SURGICAL LIGHT HANDLE (MISCELLANEOUS) ×2 IMPLANT
CUFF TOURNIQUET SINGLE 34IN LL (TOURNIQUET CUFF) ×2 IMPLANT
CUFF TOURNIQUET SINGLE 44IN (TOURNIQUET CUFF) IMPLANT
DECANTER SPIKE VIAL GLASS SM (MISCELLANEOUS) ×2 IMPLANT
DRAPE EXTREMITY T 121X128X90 (DRAPE) ×2 IMPLANT
DRAPE HALF SHEET 40X57 (DRAPES) ×4 IMPLANT
DRAPE U-SHAPE 47X51 STRL (DRAPES) ×2 IMPLANT
DRSG AQUACEL AG ADV 3.5X 6 (GAUZE/BANDAGES/DRESSINGS) ×2 IMPLANT
DRSG AQUACEL AG ADV 3.5X10 (GAUZE/BANDAGES/DRESSINGS) ×2 IMPLANT
DURAPREP 26ML APPLICATOR (WOUND CARE) ×2 IMPLANT
ELECT REM PT RETURN 9FT ADLT (ELECTROSURGICAL) ×2
ELECTRODE REM PT RTRN 9FT ADLT (ELECTROSURGICAL) ×1 IMPLANT
GLOVE BIO SURGEON STRL SZ8 (GLOVE) ×4 IMPLANT
GLOVE BIOGEL PI IND STRL 8 (GLOVE) ×2 IMPLANT
GLOVE BIOGEL PI INDICATOR 8 (GLOVE) ×2
GOWN STRL REUS W/ TWL LRG LVL3 (GOWN DISPOSABLE) ×1 IMPLANT
GOWN STRL REUS W/ TWL XL LVL3 (GOWN DISPOSABLE) ×2 IMPLANT
GOWN STRL REUS W/TWL LRG LVL3 (GOWN DISPOSABLE) ×1
GOWN STRL REUS W/TWL XL LVL3 (GOWN DISPOSABLE) ×2
HANDPIECE INTERPULSE COAX TIP (DISPOSABLE) ×1
HOOD PEEL AWAY FACE SHEILD DIS (HOOD) ×4 IMPLANT
IMMOBILIZER KNEE 22 (SOFTGOODS) ×2 IMPLANT
IMMOBILIZER KNEE 22 UNIV (SOFTGOODS) ×2 IMPLANT
KIT BASIN OR (CUSTOM PROCEDURE TRAY) ×2 IMPLANT
KIT ROOM TURNOVER OR (KITS) ×2 IMPLANT
MANIFOLD NEPTUNE II (INSTRUMENTS) ×2 IMPLANT
NEEDLE HYPO 21X1 ECLIPSE (NEEDLE) ×2 IMPLANT
NS IRRIG 1000ML POUR BTL (IV SOLUTION) ×2 IMPLANT
PACK TOTAL JOINT (CUSTOM PROCEDURE TRAY) ×2 IMPLANT
PAD ARMBOARD 7.5X6 YLW CONV (MISCELLANEOUS) ×4 IMPLANT
PIN STEINMAN FIXATION KNEE (PIN) IMPLANT
SET HNDPC FAN SPRY TIP SCT (DISPOSABLE) ×1 IMPLANT
STRIP CLOSURE SKIN 1/2X4 (GAUZE/BANDAGES/DRESSINGS) ×2 IMPLANT
SUT VIC AB 0 CT1 27 (SUTURE) ×1
SUT VIC AB 0 CT1 27XBRD ANBCTR (SUTURE) ×1 IMPLANT
SUT VIC AB 2-0 CT1 27 (SUTURE) ×1
SUT VIC AB 2-0 CT1 TAPERPNT 27 (SUTURE) ×1 IMPLANT
SUT VIC AB 3-0 FS2 27 (SUTURE) ×2 IMPLANT
SUT VLOC 180 0 24IN GS25 (SUTURE) ×2 IMPLANT
SYR 50ML LL SCALE MARK (SYRINGE) ×2 IMPLANT
TOWEL OR 17X24 6PK STRL BLUE (TOWEL DISPOSABLE) ×2 IMPLANT
TOWEL OR 17X26 10 PK STRL BLUE (TOWEL DISPOSABLE) ×2 IMPLANT
TRAY CATH 16FR W/PLASTIC CATH (SET/KITS/TRAYS/PACK) IMPLANT

## 2017-11-12 NOTE — Op Note (Signed)
PREOP DIAGNOSIS: DJD LEFT KNEE POSTOP DIAGNOSIS:  same PROCEDURE: LEFT TKR ANESTHESIA: Spinal and MAC ATTENDING SURGEON: Yoneko Talerico G ASSISTANTLoni Dolly PA  INDICATIONS FOR PROCEDURE: Bradley Baker is a 57 y.o. male who has struggled for a long time with pain due to degenerative arthritis of the left knee.  The patient has failed many conservative non-operative measures and at this point has pain which limits the ability to sleep and walk.  The patient is offered total knee replacement.  Informed operative consent was obtained after discussion of possible risks of anesthesia, infection, neurovascular injury, DVT, and death.  The importance of the post-operative rehabilitation protocol to optimize result was stressed extensively with the patient.  SUMMARY OF FINDINGS AND PROCEDURE:  Bradley Baker was taken to the operative suite where under the above anesthesia a left knee replacement was performed.  There were advanced degenerative changes and the bone quality was excellent.  We used the DePuyAttune system and placed size 7 femur, 9 tibia, 41 mm all polyethylene patella, and a size 6 mm spacer.  Loni Dolly PA-C assisted throughout and was invaluable to the completion of the case in that he helped retract and maintain exposure while I placed the components.  He also helped close thereby minimizing OR time.  The patient was admitted for appropriate post-op care to include perioperative antibiotics and mechanical and pharmacologic measures for DVT prophylaxis.  DESCRIPTION OF PROCEDURE:  Bradley Baker was taken to the operative suite where the above anesthesia was applied.  The patient was positioned supine and prepped and draped in normal sterile fashion.  An appropriate time out was performed.  After the administration of vancomycin pre-op antibiotic the leg was elevated and exsanguinated and a tourniquet inflated.  A standard longitudinal incision was made on the anterior knee.  Dissection was  carried down to the extensor mechanism.  All appropriate anti-infective measures were used including the pre-operative antibiotic, betadine impregnated drape, and closed hooded exhaust systems for each member of the surgical team.  A medial parapatellar incision was made in the extensor mechanism and the knee cap flipped and the knee flexed.  Some residual meniscal tissues were removed along with any remaining ACL/PCL tissue.  A guide was placed on the tibia and a flat cut was made on it's superior surface.  An intramedullary guide was placed in the femur and was utilized to make anterior and posterior cuts creating an appropriate flexion gap.  A second intramedullary guide was placed in the femur to make a distal cut properly balancing the knee with an extension gap equal to the flexion gap.  The three bones sized to the above mentioned sizes and the appropriate guides were placed and utilized.  A trial reduction was done and the knee easily came to full extension and the patella tracked well on flexion.  The trial components were removed and all bones were cleaned with pulsatile lavage and then dried thoroughly.  Cement was mixed and was pressurized onto the bones followed by placement of the aforementioned components.  Excess cement was trimmed and pressure was held on the components until the cement had hardened.  The tourniquet was deflated and a small amount of bleeding was controlled with cautery and pressure.  The knee was irrigated thoroughly.  The extensor mechanism was re-approximated with V-loc suture in running fashion.  The knee was flexed and the repair was solid.  The subcutaneous tissues were re-approximated with #0 and #2-0 vicryl and the skin closed with a subcuticular  stitch and steristrips.  A sterile dressing was applied.  Intraoperative fluids, EBL, and tourniquet time can be obtained from anesthesia records.  DISPOSITION:  The patient was taken to recovery room in stable condition and  admitted for appropriate post-op care to include peri-operative antibiotic and DVT prophylaxis with mechanical and pharmacologic measures.  Della Scrivener G 11/12/2017, 2:42 PM

## 2017-11-12 NOTE — Transfer of Care (Signed)
Immediate Anesthesia Transfer of Care Note  Patient: Bradley Baker  Procedure(s) Performed: TOTAL KNEE ARTHROPLASTY (Left )  Patient Location: PACU  Anesthesia Type:Spinal  Level of Consciousness: awake  Airway & Oxygen Therapy: Patient Spontanous Breathing  Post-op Assessment: Report given to RN and Post -op Vital signs reviewed and stable  Post vital signs: Reviewed and stable  Last Vitals:  Vitals:   11/12/17 1235 11/12/17 1248  BP: (!) 156/91 (!) 151/92  Pulse: 87 83  Resp:    Temp:    SpO2: 99% 99%    Last Pain:  Vitals:   11/12/17 1048  TempSrc: Oral      Patients Stated Pain Goal: 3 (70/92/95 7473)  Complications: No apparent anesthesia complications

## 2017-11-12 NOTE — Interval H&P Note (Signed)
History and Physical Interval Note:  11/12/2017 11:56 AM  Bradley Baker  has presented today for surgery, with the diagnosis of LEFT KNEE DEGENERATIVE JOINT DISEASE  The various methods of treatment have been discussed with the patient and family. After consideration of risks, benefits and other options for treatment, the patient has consented to  Procedure(s): TOTAL KNEE ARTHROPLASTY (Left) as a surgical intervention .  The patient's history has been reviewed, patient examined, no change in status, stable for surgery.  I have reviewed the patient's chart and labs.  Questions were answered to the patient's satisfaction.     [name]

## 2017-11-12 NOTE — Evaluation (Signed)
Physical Therapy Evaluation Patient Details Name: Bradley Baker MRN: [id number] DOB: 1961-01-20 Today's Date: 11/12/2017   History of Present Illness  pt is a 57 y/o male with pmh significant for HTN,  and  OA or knees, admitted for an elective TKA of the left knee.  Clinical Impression  Pt admitted with/for elective TKA.  Pt currently limited functionally due to the problems listed below.  (see problems list.)  Pt will benefit from PT to maximize function and safety to be able to get home safely with available assist.     Follow Up Recommendations Follow surgeon's recommendation for DC plan and follow-up therapies    Equipment Recommendations  Rolling walker with 5" wheels;3in1 (PT)    Recommendations for Other Services       Precautions / Restrictions Precautions Precautions: Knee Restrictions Weight Bearing Restrictions: Yes LLE Weight Bearing: Weight bearing as tolerated      Mobility  Bed Mobility Overal bed mobility: Needs Assistance Bed Mobility: Supine to Sit;Sit to Supine       Sit to supine: Supervision   General bed mobility comments: cues for direction only, no assist needed  Transfers Overall transfer level: Needs assistance   Transfers: Sit to/from Stand Sit to Stand: Supervision         General transfer comment: cues for safety,  Ambulation/Gait Ambulation/Gait assistance: Min guard;Supervision Ambulation Distance (Feet): 100 Feet Assistive device: Rolling walker (2 wheeled) Gait Pattern/deviations: Step-through pattern   Gait velocity interpretation: at or above normal speed for age/gender General Gait Details: steady step to/through gait pattern  Stairs            Wheelchair Mobility    Modified Rankin (Stroke Patients Only)       Balance Overall balance assessment: No apparent balance deficits (not formally assessed)                                           Pertinent Vitals/Pain Pain Assessment:  0-10 Pain Score: 2  Pain Location: left knee Pain Descriptors / Indicators: Discomfort Pain Intervention(s): Monitored during session;Premedicated before session    Home Living Family/patient expects to be discharged to:: Private residence Living Arrangements: Spouse/significant other Available Help at Discharge: Family;Available 24 hours/day Type of Home: House Home Access: Stairs to enter Entrance Stairs-Rails: None Entrance Stairs-Number of Steps: 2 Home Layout: One level Home Equipment: None      Prior Function Level of Independence: Independent               Hand Dominance        Extremity/Trunk Assessment   Upper Extremity Assessment Upper Extremity Assessment: Overall WFL for tasks assessed    Lower Extremity Assessment Lower Extremity Assessment: Overall WFL for tasks assessed;LLE deficits/detail LLE Deficits / Details: knee flexion A/AAROM >90*       Communication   Communication: No difficulties  Cognition Arousal/Alertness: Awake/alert Behavior During Therapy: WFL for tasks assessed/performed Overall Cognitive Status: Within Functional Limits for tasks assessed                                        General Comments      Exercises Total Joint Exercises Ankle Circles/Pumps: AROM;10 reps;Supine Quad Sets: AROM;Both;10 reps Gluteal Sets: AROM;Both;10 reps;Supine Heel Slides: AROM;Left;10 reps;Supine Hip ABduction/ADduction: AROM;Left;5 reps;Supine Straight Leg  Raises: AROM;Left;10 reps;Supine Goniometric ROM: >90   Assessment/Plan    PT Assessment Patient needs continued PT services  PT Problem List Decreased strength;Decreased range of motion;Decreased activity tolerance;Decreased mobility;Decreased knowledge of use of DME;Pain       PT Treatment Interventions DME instruction;Gait training;Stair training;Functional mobility training;Therapeutic activities;Therapeutic exercise;Patient/family education    PT Goals  (Current goals can be found in the Care Plan section)  Acute Rehab PT Goals Patient Stated Goal: Independent, back to work PT Goal Formulation: With patient Time For Goal Achievement: 11/19/17 Potential to Achieve Goals: Good    Frequency 7X/week   Barriers to discharge        Co-evaluation               AM-PAC PT "6 Clicks" Daily Activity  Outcome Measure Difficulty turning over in bed (including adjusting bedclothes, sheets and blankets)?: None Difficulty moving from lying on back to sitting on the side of the bed? : None Difficulty sitting down on and standing up from a chair with arms (e.g., wheelchair, bedside commode, etc,.)?: None Help needed moving to and from a bed to chair (including a wheelchair)?: A Little Help needed walking in hospital room?: A Little Help needed climbing 3-5 steps with a railing? : A Little 6 Click Score: 21    End of Session   Activity Tolerance: Patient tolerated treatment well Patient left: in bed;with call bell/phone within reach;with family/visitor present Nurse Communication: Mobility status PT Visit Diagnosis: Other abnormalities of gait and mobility (R26.89);Pain Pain - Right/Left: Left Pain - part of body: Knee    Time: 1694-5038 PT Time Calculation (min) (ACUTE ONLY): 25 min   Charges:   PT Evaluation $PT Eval Low Complexity: 1 Low PT Treatments $Gait Training: 8-22 mins   PT G Codes:        11-23-2017  Donnella Sham, PT [phone]  (pager)  Tessie Fass Devany Aja Nov 23, 2017, 10:11 PM

## 2017-11-12 NOTE — Anesthesia Procedure Notes (Signed)
Anesthesia Regional Block: Adductor canal block   Pre-Anesthetic Checklist: ,, timeout performed, Correct Patient, Correct Site, Correct Laterality, Correct Procedure, Correct Position, site marked, Risks and benefits discussed,  Surgical consent,  Pre-op evaluation,  At surgeon's request and post-op pain management  Laterality: Left     Needles:  Injection technique: Single-shot  Needle Type: Stimulator Needle - 80          Additional Needles:   Procedures: Doppler guided,,,, ultrasound used (permanent image in chart),,,,  Narrative:  Start time: 11/12/2017 12:40 PM End time: 11/12/2017 12:45 PM  Performed by: Personally  Anesthesiologist: Belinda Block, MD

## 2017-11-12 NOTE — Anesthesia Procedure Notes (Signed)
Spinal  Patient location during procedure: OR Start time: 11/12/2017 1:00 PM End time: 11/12/2017 1:10 PM Staffing Anesthesiologist: Murvin Natal, MD Performed: anesthesiologist  Preanesthetic Checklist Completed: patient identified, surgical consent, pre-op evaluation, timeout performed, IV checked, risks and benefits discussed and monitors and equipment checked Spinal Block Patient position: sitting Prep: DuraPrep Patient monitoring: cardiac monitor, continuous pulse ox and blood pressure Approach: midline Location: L4-5 Injection technique: single-shot Needle Needle type: Pencan  Needle gauge: 24 G Needle length: 9 cm Assessment Sensory level: T10 Additional Notes Functioning IV was confirmed and monitors were applied. Sterile prep and drape, including hand hygiene and sterile gloves were used. The patient was positioned and the spine was prepped. The skin was anesthetized with lidocaine.  Free flow of clear CSF was obtained prior to injecting local anesthetic into the CSF.  The spinal needle aspirated freely following injection.  The needle was carefully withdrawn.  The patient tolerated the procedure well.

## 2017-11-12 NOTE — Anesthesia Preprocedure Evaluation (Signed)
Anesthesia Evaluation  Patient identified by MRN, date of birth, ID band Patient awake    Reviewed: Allergy & Precautions, NPO status , Patient's Chart, lab work & pertinent test results  Airway Mallampati: II  TM Distance: >3 FB     Dental   Pulmonary former smoker,    breath sounds clear to auscultation       Cardiovascular hypertension,  Rhythm:Regular Rate:Normal     Neuro/Psych    GI/Hepatic negative GI ROS, Neg liver ROS,   Endo/Other  negative endocrine ROS  Renal/GU negative Renal ROS     Musculoskeletal  (+) Arthritis ,   Abdominal   Peds  Hematology   Anesthesia Other Findings   Reproductive/Obstetrics                             Anesthesia Physical Anesthesia Plan  ASA: III  Anesthesia Plan: Regional and Spinal   Post-op Pain Management:    Induction: Intravenous  PONV Risk Score and Plan: 2 and Treatment may vary due to age or medical condition, Ondansetron, Dexamethasone and Midazolam  Airway Management Planned: Nasal Cannula and Simple Face Mask  Additional Equipment:   Intra-op Plan:   Post-operative Plan:   Informed Consent: I have reviewed the patients History and Physical, chart, labs and discussed the procedure including the risks, benefits and alternatives for the proposed anesthesia with the patient or authorized representative who has indicated his/her understanding and acceptance.   Dental advisory given  Plan Discussed with: CRNA, Anesthesiologist and Surgeon  Anesthesia Plan Comments:         Anesthesia Quick Evaluation

## 2017-11-12 NOTE — Anesthesia Postprocedure Evaluation (Signed)
Anesthesia Post Note  Patient: Bradley Baker  Procedure(s) Performed: TOTAL KNEE ARTHROPLASTY (Left )     Patient location during evaluation: PACU Anesthesia Type: Regional and Spinal Level of consciousness: oriented and awake and alert Pain management: pain level controlled Vital Signs Assessment: post-procedure vital signs reviewed and stable Respiratory status: spontaneous breathing, respiratory function stable and patient connected to nasal cannula oxygen Cardiovascular status: blood pressure returned to baseline and stable Postop Assessment: no headache, no backache, no apparent nausea or vomiting and spinal receding Anesthetic complications: no    Last Vitals:  Vitals:   11/12/17 1838 11/12/17 2031  BP: (!) 191/115 (!) 152/102  Pulse: 91 79  Resp:  18  Temp: 36.9 C 36.8 C  SpO2: 98% 99%    Last Pain:  Vitals:   11/12/17 2031  TempSrc: Oral  PainSc:                  Bradley Baker Bradley Baker

## 2017-11-12 NOTE — Progress Notes (Signed)
Pt's bandage has moderate amount of drainage after PT session today, started to leak out of aquacel. Spoke with PA, Mitzi Hansen, and was instructed to reinforce with ABT and ACE.

## 2017-11-12 NOTE — Progress Notes (Signed)
Care of pt assumed by MA Aruna Nestler RN 

## 2017-11-13 ENCOUNTER — Encounter (HOSPITAL_COMMUNITY): Payer: Self-pay | Admitting: Orthopaedic Surgery

## 2017-11-13 DIAGNOSIS — Z96652 Presence of left artificial knee joint: Secondary | ICD-10-CM | POA: Diagnosis not present

## 2017-11-13 DIAGNOSIS — M1712 Unilateral primary osteoarthritis, left knee: Secondary | ICD-10-CM | POA: Diagnosis not present

## 2017-11-13 MED ORDER — ASPIRIN 325 MG PO TBEC: 325.0000 mg | DELAYED_RELEASE_TABLET | Freq: Two times a day (BID) | ORAL | 0 refills | Status: DC

## 2017-11-13 MED ORDER — TIZANIDINE HCL 4 MG PO TABS: 4.0000 mg | ORAL_TABLET | Freq: Four times a day (QID) | ORAL | 1 refills | Status: AC | PRN

## 2017-11-13 MED ORDER — BISACODYL 5 MG PO TBEC: 5.0000 mg | DELAYED_RELEASE_TABLET | Freq: Every day | ORAL | 0 refills | Status: DC | PRN

## 2017-11-13 MED ORDER — HYDROCODONE-ACETAMINOPHEN 7.5-325 MG PO TABS: 1.0000 | ORAL_TABLET | ORAL | 0 refills | Status: DC | PRN

## 2017-11-13 NOTE — Progress Notes (Signed)
Physical Therapy Treatment Patient Details Name: Bradley Baker MRN: [id number] DOB: [birth year] Today's Date: 11/13/2017    History of Present Illness pt is a 57 y/o male with pmh significant for HTN,  and  OA or knees, admitted for an elective TKA of the left knee.    PT Comments    Continuing work on functional mobility and activity tolerance;  Completed set of knee exercises, and solid with ambulation household distances; Stair training complete, discussed car transfers; Stressed to pt the need to position knee in extension stretch when at rest to maximize knee extension range; OK for dc home from PT standpoint    Follow Up Recommendations  Follow surgeon's recommendation for DC plan and follow-up therapies     Equipment Recommendations  Rolling walker with 5" wheels;3in1 (PT)    Recommendations for Other Services       Precautions / Restrictions Precautions Precautions: Knee Precaution Booklet Issued: Yes (comment) Restrictions LLE Weight Bearing: Weight bearing as tolerated  Pt educated to not allow any pillow or bolster under knee for healing with optimal range of motion.    Mobility  Bed Mobility Overal bed mobility: Needs Assistance Bed Mobility: Supine to Sit;Sit to Supine     Supine to sit: Supervision Sit to supine: Supervision   General bed mobility comments: cues for direction only, no assist needed  Transfers Overall transfer level: Needs assistance Equipment used: Rolling walker (2 wheeled) Transfers: Sit to/from Stand Sit to Stand: Supervision         General transfer comment: cues for safety,  Ambulation/Gait Ambulation/Gait assistance: Supervision Ambulation Distance (Feet): 550 Feet Assistive device: Rolling walker (2 wheeled) Gait Pattern/deviations: Step-through pattern Gait velocity: slow   General Gait Details: steady step to/through gait pattern; Cues to activate quad for full extension and stability in stance; cues for posture as  well   Stairs Stairs: Yes   Stair Management: No rails;Backwards;With walker Number of Stairs: 2 General stair comments: Cues for technqiue  Wheelchair Mobility    Modified Rankin (Stroke Patients Only)       Balance Overall balance assessment: No apparent balance deficits (not formally assessed)                                          Cognition Arousal/Alertness: Awake/alert Behavior During Therapy: WFL for tasks assessed/performed Overall Cognitive Status: Within Functional Limits for tasks assessed                                        Exercises Total Joint Exercises Quad Sets: AROM;Left;20 reps Short Arc Quad: AAROM;AROM;Left;10 reps Heel Slides: AROM;Left;10 reps;Supine Straight Leg Raises: AROM;Left;10 reps;Supine Goniometric ROM: approx 5-90 degrees    General Comments General comments (skin integrity, edema, etc.): Took extra time to reinforce proper positioning at rest for optimal knee extension      Pertinent Vitals/Pain Pain Assessment: 0-10 Pain Score: 6  Pain Location: left knee Pain Descriptors / Indicators: Discomfort Pain Intervention(s): Monitored during session    Home Living                      Prior Function            PT Goals (current goals can now be found in the care plan section) Acute Rehab PT  Goals Patient Stated Goal: Independent, back to work PT Goal Formulation: With patient Time For Goal Achievement: 11/19/17 Potential to Achieve Goals: Good Progress towards PT goals: Progressing toward goals    Frequency    7X/week      PT Plan Current plan remains appropriate    Co-evaluation              AM-PAC PT "6 Clicks" Daily Activity  Outcome Measure  Difficulty turning over in bed (including adjusting bedclothes, sheets and blankets)?: None Difficulty moving from lying on back to sitting on the side of the bed? : None Difficulty sitting down on and standing up from  a chair with arms (e.g., wheelchair, bedside commode, etc,.)?: None Help needed moving to and from a bed to chair (including a wheelchair)?: None Help needed walking in hospital room?: None Help needed climbing 3-5 steps with a railing? : A Little 6 Click Score: 23    End of Session   Activity Tolerance: Patient tolerated treatment well Patient left: in chair;with call bell/phone within reach Nurse Communication: Mobility status PT Visit Diagnosis: Other abnormalities of gait and mobility (R26.89);Pain Pain - Right/Left: Left Pain - part of body: Knee     Time: 1010-1104 PT Time Calculation (min) (ACUTE ONLY): 54 min  Charges:  $Gait Training: 23-37 mins $Therapeutic Exercise: 8-22 mins $Therapeutic Activity: 8-22 mins                    G Codes:       Roney Marion, PT  Acute Rehabilitation Services Pager [phone] Office [place] 11/13/2017, 1:38 PM

## 2017-11-13 NOTE — Care Management Note (Signed)
Case Management Note  Patient Details  Name: Rexford Prevo MRN: [id number] Date of Birth: 01/28/1961  Subjective/Objective:  57 yr old gentleman s/p left total knee arthroplasty.                   Action/Plan: Case manager spoke with patient and his wife concerning discharge plan and DME. Referral for Home Health was given to Christa See, [facility]. patietn will have family support at [place].    Expected Discharge Date:  11/13/17               Expected Discharge Plan:  [place]  In-House Referral:  NA  Discharge planning Services  CM Consult  Post Acute Care Choice:  Durable Medical Equipment, Home Health Choice offered to:  Patient  DME Arranged:  Walker rolling(declined 3in1) DME Agency:  [employer]  HH Arranged:  PT HH Agency:  [facility] (formerly [employer])  Status of Service:  Completed, signed off  If discussed at [employer], dates discussed:    Additional Comments:  Ninfa Meeker, RN 11/13/2017, 12:35 PM

## 2017-11-13 NOTE — Discharge Summary (Signed)
Patient ID: Bradley Baker MRN: [id number] DOB/AGE: [birth year] 57 y.o.  Admit date: 11/12/2017 Discharge date: 11/13/2017  Admission Diagnoses:  Principal Problem:   Primary osteoarthritis of left knee   Discharge Diagnoses:  Same  Past Medical History:  Diagnosis Date  . ADHD (attention deficit hyperactivity disorder)   . Arthritis   . Cancer ([place])    skin cancers  . History of kidney stones   . Hypertension   . Left knee DJD     Surgeries: Procedure(s): TOTAL KNEE ARTHROPLASTY on 11/12/2017   Consultants:   Discharged Condition: Improved  Hospital Course: Bradley Baker is an 57 y.o. male who was admitted 11/12/2017 for operative treatment ofPrimary osteoarthritis of left knee. Patient has severe unremitting pain that affects sleep, daily activities, and work/hobbies. After pre-op clearance the patient was taken to the operating room on 11/12/2017 and underwent  Procedure(s): TOTAL KNEE ARTHROPLASTY.    Patient was given perioperative antibiotics:  Anti-infectives (From admission, onward)   Start     Dose/Rate Route Frequency Ordered Stop   11/12/17 2330  vancomycin (VANCOCIN) IVPB 1000 mg/200 mL premix     1,000 mg 200 mL/hr over 60 Minutes Intravenous Every 12 hours 11/12/17 1831 11/13/17 0107   11/12/17 1330  vancomycin (VANCOCIN) 1,500 mg in sodium chloride 0.9 % 500 mL IVPB     1,500 mg 250 mL/hr over 120 Minutes Intravenous To Short Stay 11/12/17 1328 11/12/17 1254   11/12/17 0700  vancomycin (VANCOCIN) 1,500 mg in sodium chloride 0.9 % 500 mL IVPB     1,500 mg 250 mL/hr over 120 Minutes Intravenous To Short Stay 11/11/17 4235 11/12/17 1307       Patient was given sequential compression devices, early ambulation, and chemoprophylaxis to prevent DVT.  Patient benefited maximally from hospital stay and there were no complications.    Recent vital signs:  Patient Vitals for the past 24 hrs:  BP Temp Temp src Pulse Resp SpO2 Height Weight  11/13/17 0519 (!) 159/71  97.9 F (36.6 C) Oral 71 - 96 % - -  11/13/17 0048 128/66 98 F (36.7 C) Oral 81 - 99 % - -  11/12/17 2031 (!) 152/102 98.3 F (36.8 C) Oral 79 18 99 % - -  11/12/17 1838 (!) 191/115 98.4 F (36.9 C) Oral 91 - 98 % - -  11/12/17 1807 (!) 157/87 (!) 97 F (36.1 C) - - 18 - - -  11/12/17 1730 130/88 - - 80 18 97 % - -  11/12/17 1700 (!) 159/94 - - 69 10 98 % - -  11/12/17 1645 (!) 157/96 - - 71 17 96 % - -  11/12/17 1630 (!) 161/95 - - 73 16 100 % - -  11/12/17 1615 (!) 154/98 - - 71 19 98 % - -  11/12/17 1600 (!) 148/97 - - 69 15 97 % - -  11/12/17 1542 (!) 157/97 - - 77 18 99 % - -  11/12/17 1534 - - - 68 18 97 % - -  11/12/17 1516 (!) 140/101 - - 82 14 96 % - -  11/12/17 1514 (!) 140/102 (!) 97.5 F (36.4 C) - 85 18 96 % - -  11/12/17 1248 (!) 151/92 - - 83 - 99 % - -  11/12/17 1235 (!) 156/91 - - 87 - 99 % - -  11/12/17 1230 (!) 160/86 - - 89 - 99 % - -  11/12/17 1109 (!) 183/108 - - - - - - -  11/12/17 1048 (!) 190/118 98.8 F (37.1 C) Oral 94 18 99 % 6\' 3"  (1.905 m) 99.8 kg (220 lb 1.6 oz)     Recent laboratory studies: No results for input(s): WBC, HGB, HCT, PLT, NA, K, CL, CO2, BUN, CREATININE, GLUCOSE, INR, CALCIUM in the last 72 hours.  Invalid input(s): PT, 2   Discharge Medications:   Allergies as of 11/13/2017      Reactions   Penicillins Hives, Nausea And Vomiting   Has patient had a PCN reaction causing immediate rash, facial/tongue/throat swelling, SOB or lightheadedness with hypotension: Unknown Has patient had a PCN reaction causing severe rash involving mucus membranes or skin necrosis: Yes Has patient had a PCN reaction that required hospitalization: No Has patient had a PCN reaction occurring within the last 10 years: No If all of the above answers are "NO", then may proceed with Cephalosporin use.      Medication List    TAKE these medications   aspirin 325 MG EC tablet Take 1 tablet (325 mg total) by mouth 2 (two) times daily after a meal.    atorvastatin 40 MG tablet Commonly known as:  LIPITOR Take 20 mg by mouth daily.   bisacodyl 5 MG EC tablet Commonly known as:  DULCOLAX Take 1 tablet (5 mg total) by mouth daily as needed for moderate constipation.   doxylamine (Sleep) 25 MG tablet Commonly known as:  UNISOM Take 25 mg by mouth at bedtime as needed for sleep.   finasteride 1 MG tablet Commonly known as:  PROPECIA Take 0.25 mg by mouth daily.   HYDROcodone-acetaminophen 7.5-325 MG tablet Commonly known as:  NORCO Take 1-2 tablets by mouth every 4 (four) hours as needed for severe pain (pain score 7-10).   ibuprofen 200 MG tablet Commonly known as:  ADVIL,MOTRIN Take 400-800 mg by mouth daily as needed for moderate pain.   losartan 100 MG tablet Commonly known as:  COZAAR Take 100 mg by mouth daily.   tiZANidine 4 MG tablet Commonly known as:  ZANAFLEX Take 1 tablet (4 mg total) by mouth every 6 (six) hours as needed.            Durable Medical Equipment  (From admission, onward)        Start     Ordered   11/12/17 1832  DME Walker rolling  Once    Question:  Patient needs a walker to treat with the following condition  Answer:  Primary osteoarthritis of left knee   11/12/17 1831   11/12/17 1832  DME 3 n 1  Once     11/12/17 1831   11/12/17 1832  DME Bedside commode  Once    Question:  Patient needs a bedside commode to treat with the following condition  Answer:  Primary osteoarthritis of left knee   11/12/17 1831      Diagnostic Studies: Dg Chest 2 View  Result Date: 11/04/2017 CLINICAL DATA:  Preop chest x-ray for total knee replacement, history of hypertension EXAM: CHEST  2 VIEW COMPARISON:  Chest x-ray of 09/06/2004 FINDINGS: No active infiltrate or effusion is seen. Mediastinal and hilar contours are unremarkable. The heart is within normal limits in size. Mild degenerative changes noted in the mid to lower thoracic spine. IMPRESSION: No active cardiopulmonary disease. Electronically  Signed   By: Ivar Drape M.D.   On: 11/04/2017 09:05    Disposition: Final discharge disposition not confirmed  Discharge Instructions    Call MD / Call 911  Complete by:  As directed    If you experience chest pain or shortness of breath, CALL 911 and be transported to the hospital emergency room.  If you develope a fever above 101 F, pus (white drainage) or increased drainage or redness at the wound, or calf pain, call your surgeon's office.   Constipation Prevention   Complete by:  As directed    Drink plenty of fluids.  Prune juice may be helpful.  You may use a stool softener, such as Colace (over the counter) 100 mg twice a day.  Use MiraLax (over the counter) for constipation as needed.   Diet - low sodium heart healthy   Complete by:  As directed    Discharge instructions   Complete by:  As directed    INSTRUCTIONS AFTER JOINT REPLACEMENT   Remove items at home which could result in a fall. This includes throw rugs or furniture in walking pathways ICE to the affected joint every three hours while awake for 30 minutes at a time, for at least the first 3-5 days, and then as needed for pain and swelling.  Continue to use ice for pain and swelling. You may notice swelling that will progress down to the foot and ankle.  This is normal after surgery.  Elevate your leg when you are not up walking on it.   Continue to use the breathing machine you got in the hospital (incentive spirometer) which will help keep your temperature down.  It is common for your temperature to cycle up and down following surgery, especially at night when you are not up moving around and exerting yourself.  The breathing machine keeps your lungs expanded and your temperature down.   DIET:  As you were doing prior to hospitalization, we recommend a well-balanced diet.  DRESSING / WOUND CARE / SHOWERING  You may shower 3 days after surgery, but keep the wounds dry during showering.  You may use an occlusive plastic  wrap (Press'n Seal for example), NO SOAKING/SUBMERGING IN THE BATHTUB.  If the bandage gets wet, change with a clean dry gauze.  If the incision gets wet, pat the wound dry with a clean towel.  ACTIVITY  Increase activity slowly as tolerated, but follow the weight bearing instructions below.   No driving for 6 weeks or until further direction given by your physician.  You cannot drive while taking narcotics.  No lifting or carrying greater than 10 lbs. until further directed by your surgeon. Avoid periods of inactivity such as sitting longer than an hour when not asleep. This helps prevent blood clots.  You may return to work once you are authorized by your doctor.     WEIGHT BEARING   Weight bearing as tolerated with assist device (walker, cane, etc) as directed, use it as long as suggested by your surgeon or therapist, typically at least 4-6 weeks.   EXERCISES  Results after joint replacement surgery are often greatly improved when you follow the exercise, range of motion and muscle strengthening exercises prescribed by your doctor. Safety measures are also important to protect the joint from further injury. Any time any of these exercises cause you to have increased pain or swelling, decrease what you are doing until you are comfortable again and then slowly increase them. If you have problems or questions, call your caregiver or physical therapist for advice.   Rehabilitation is important following a joint replacement. After just a few days of immobilization, the muscles of the leg  can become weakened and shrink (atrophy).  These exercises are designed to build up the tone and strength of the thigh and leg muscles and to improve motion. Often times heat used for twenty to thirty minutes before working out will loosen up your tissues and help with improving the range of motion but do not use heat for the first two weeks following surgery (sometimes heat can increase post-operative swelling).    These exercises can be done on a training (exercise) mat, on the floor, on a table or on a bed. Use whatever works the best and is most comfortable for you.    Use music or television while you are exercising so that the exercises are a pleasant break in your day. This will make your life better with the exercises acting as a break in your routine that you can look forward to.   Perform all exercises about fifteen times, three times per day or as directed.  You should exercise both the operative leg and the other leg as well.   Exercises include:   Quad Sets - Tighten up the muscle on the front of the thigh (Quad) and hold for 5-10 seconds.   Straight Leg Raises - With your knee straight (if you were given a brace, keep it on), lift the leg to 60 degrees, hold for 3 seconds, and slowly lower the leg.  Perform this exercise against resistance later as your leg gets stronger.  Leg Slides: Lying on your back, slowly slide your foot toward your buttocks, bending your knee up off the floor (only go as far as is comfortable). Then slowly slide your foot back down until your leg is flat on the floor again.  Angel Wings: Lying on your back spread your legs to the side as far apart as you can without causing discomfort.  Hamstring Strength:  Lying on your back, push your heel against the floor with your leg straight by tightening up the muscles of your buttocks.  Repeat, but this time bend your knee to a comfortable angle, and push your heel against the floor.  You may put a pillow under the heel to make it more comfortable if necessary.   A rehabilitation program following joint replacement surgery can speed recovery and prevent re-injury in the future due to weakened muscles. Contact your doctor or a physical therapist for more information on knee rehabilitation.    CONSTIPATION  Constipation is defined medically as fewer than three stools per week and severe constipation as less than one stool per week.   Even if you have a regular bowel pattern at home, your normal regimen is likely to be disrupted due to multiple reasons following surgery.  Combination of anesthesia, postoperative narcotics, change in appetite and fluid intake all can affect your bowels.   YOU MUST use at least one of the following options; they are listed in order of increasing strength to get the job done.  They are all available over the counter, and you may need to use some, POSSIBLY even all of these options:    Drink plenty of fluids (prune juice may be helpful) and high fiber foods Colace 100 mg by mouth twice a day  Senokot for constipation as directed and as needed Dulcolax (bisacodyl), take with full glass of water  Miralax (polyethylene glycol) once or twice a day as needed.  If you have tried all these things and are unable to have a bowel movement in the first 3-4 days after  surgery call either your surgeon or your primary doctor.    If you experience loose stools or diarrhea, hold the medications until you stool forms back up.  If your symptoms do not get better within 1 week or if they get worse, check with your doctor.  If you experience "the worst abdominal pain ever" or develop nausea or vomiting, please contact the office immediately for further recommendations for treatment.   ITCHING:  If you experience itching with your medications, try taking only a single pain pill, or even half a pain pill at a time.  You can also use Benadryl over the counter for itching or also to help with sleep.   TED HOSE STOCKINGS:  Use stockings on both legs until for at least 2 weeks or as directed by physician office. They may be removed at night for sleeping.  MEDICATIONS:  See your medication summary on the "After Visit Summary" that nursing will review with you.  You may have some home medications which will be placed on hold until you complete the course of blood thinner medication.  It is important for you to complete the  blood thinner medication as prescribed.  PRECAUTIONS:  If you experience chest pain or shortness of breath - call 911 immediately for transfer to the hospital emergency department.   If you develop a fever greater that 101 F, purulent drainage from wound, increased redness or drainage from wound, foul odor from the wound/dressing, or calf pain - CONTACT YOUR SURGEON.                                                   FOLLOW-UP APPOINTMENTS:  If you do not already have a post-op appointment, please call the office for an appointment to be seen by your surgeon.  Guidelines for how soon to be seen are listed in your "After Visit Summary", but are typically between 1-4 weeks after surgery.  OTHER INSTRUCTIONS:   Knee Replacement:  Do not place pillow under knee, focus on keeping the knee straight while resting. CPM instructions: 0-90 degrees, 2 hours in the morning, 2 hours in the afternoon, and 2 hours in the evening. Place foam block, curve side up under heel at all times except when in CPM or when walking.  DO NOT modify, tear, cut, or change the foam block in any way.  MAKE SURE YOU:  Understand these instructions.  Get help right away if you are not doing well or get worse.    Thank you for letting us be a part of your medical care team.  It is a privilege we respect greatly.  We hope these instructions will help you stay on track for a fast and full recovery!   Increase activity slowly as tolerated   Complete by:  As directed       Follow-up Information    Melrose Nakayama, MD. Schedule an appointment as soon as possible for a visit in 2 weeks.   Specialty:  Orthopedic Surgery Contact information: [address] [place] [id number] [phone]            Signed: Rich Fuchs 11/13/2017, 8:07 AM

## 2017-11-14 DIAGNOSIS — I10 Essential (primary) hypertension: Secondary | ICD-10-CM | POA: Diagnosis not present

## 2017-11-14 DIAGNOSIS — Z471 Aftercare following joint replacement surgery: Secondary | ICD-10-CM | POA: Diagnosis not present

## 2017-11-15 DIAGNOSIS — Z471 Aftercare following joint replacement surgery: Secondary | ICD-10-CM | POA: Diagnosis not present

## 2017-11-15 DIAGNOSIS — I10 Essential (primary) hypertension: Secondary | ICD-10-CM | POA: Diagnosis not present

## 2017-11-18 DIAGNOSIS — I10 Essential (primary) hypertension: Secondary | ICD-10-CM | POA: Diagnosis not present

## 2017-11-18 DIAGNOSIS — Z471 Aftercare following joint replacement surgery: Secondary | ICD-10-CM | POA: Diagnosis not present

## 2017-11-20 DIAGNOSIS — Z471 Aftercare following joint replacement surgery: Secondary | ICD-10-CM | POA: Diagnosis not present

## 2017-11-20 DIAGNOSIS — I10 Essential (primary) hypertension: Secondary | ICD-10-CM | POA: Diagnosis not present

## 2017-11-22 DIAGNOSIS — I10 Essential (primary) hypertension: Secondary | ICD-10-CM | POA: Diagnosis not present

## 2017-11-22 DIAGNOSIS — Z471 Aftercare following joint replacement surgery: Secondary | ICD-10-CM | POA: Diagnosis not present

## 2017-11-25 DIAGNOSIS — Z96652 Presence of left artificial knee joint: Secondary | ICD-10-CM | POA: Diagnosis not present

## 2017-11-25 DIAGNOSIS — M1712 Unilateral primary osteoarthritis, left knee: Secondary | ICD-10-CM | POA: Diagnosis not present

## 2017-12-03 DIAGNOSIS — Z96652 Presence of left artificial knee joint: Secondary | ICD-10-CM | POA: Diagnosis not present

## 2017-12-03 DIAGNOSIS — M1712 Unilateral primary osteoarthritis, left knee: Secondary | ICD-10-CM | POA: Diagnosis not present

## 2017-12-05 DIAGNOSIS — Z96652 Presence of left artificial knee joint: Secondary | ICD-10-CM | POA: Diagnosis not present

## 2017-12-05 DIAGNOSIS — M1712 Unilateral primary osteoarthritis, left knee: Secondary | ICD-10-CM | POA: Diagnosis not present

## 2017-12-10 DIAGNOSIS — Z96652 Presence of left artificial knee joint: Secondary | ICD-10-CM | POA: Diagnosis not present

## 2017-12-10 DIAGNOSIS — M1712 Unilateral primary osteoarthritis, left knee: Secondary | ICD-10-CM | POA: Diagnosis not present

## 2017-12-13 DIAGNOSIS — M1712 Unilateral primary osteoarthritis, left knee: Secondary | ICD-10-CM | POA: Diagnosis not present

## 2017-12-13 DIAGNOSIS — Z96652 Presence of left artificial knee joint: Secondary | ICD-10-CM | POA: Diagnosis not present

## 2017-12-16 DIAGNOSIS — Z4422 Encounter for fitting and adjustment of artificial left eye: Secondary | ICD-10-CM | POA: Diagnosis not present

## 2017-12-19 DIAGNOSIS — M1712 Unilateral primary osteoarthritis, left knee: Secondary | ICD-10-CM | POA: Diagnosis not present

## 2017-12-19 DIAGNOSIS — Z96652 Presence of left artificial knee joint: Secondary | ICD-10-CM | POA: Diagnosis not present

## 2017-12-27 DIAGNOSIS — Z96652 Presence of left artificial knee joint: Secondary | ICD-10-CM | POA: Diagnosis not present

## 2017-12-27 DIAGNOSIS — M1712 Unilateral primary osteoarthritis, left knee: Secondary | ICD-10-CM | POA: Diagnosis not present

## 2017-12-30 DIAGNOSIS — M1712 Unilateral primary osteoarthritis, left knee: Secondary | ICD-10-CM | POA: Diagnosis not present

## 2017-12-30 DIAGNOSIS — Z96652 Presence of left artificial knee joint: Secondary | ICD-10-CM | POA: Diagnosis not present

## 2018-01-01 DIAGNOSIS — M1712 Unilateral primary osteoarthritis, left knee: Secondary | ICD-10-CM | POA: Diagnosis not present

## 2018-01-01 DIAGNOSIS — Z96652 Presence of left artificial knee joint: Secondary | ICD-10-CM | POA: Diagnosis not present

## 2018-01-06 DIAGNOSIS — Z0000 Encounter for general adult medical examination without abnormal findings: Secondary | ICD-10-CM | POA: Diagnosis not present

## 2018-01-06 DIAGNOSIS — I10 Essential (primary) hypertension: Secondary | ICD-10-CM | POA: Diagnosis not present

## 2018-01-06 DIAGNOSIS — M1712 Unilateral primary osteoarthritis, left knee: Secondary | ICD-10-CM | POA: Diagnosis not present

## 2018-01-06 DIAGNOSIS — R82998 Other abnormal findings in urine: Secondary | ICD-10-CM | POA: Diagnosis not present

## 2018-01-06 DIAGNOSIS — Z96652 Presence of left artificial knee joint: Secondary | ICD-10-CM | POA: Diagnosis not present

## 2018-01-08 DIAGNOSIS — M1712 Unilateral primary osteoarthritis, left knee: Secondary | ICD-10-CM | POA: Diagnosis not present

## 2018-01-08 DIAGNOSIS — Z96652 Presence of left artificial knee joint: Secondary | ICD-10-CM | POA: Diagnosis not present

## 2018-01-09 DIAGNOSIS — Z96652 Presence of left artificial knee joint: Secondary | ICD-10-CM | POA: Diagnosis not present

## 2018-01-09 DIAGNOSIS — M1712 Unilateral primary osteoarthritis, left knee: Secondary | ICD-10-CM | POA: Diagnosis not present

## 2018-01-13 DIAGNOSIS — M1712 Unilateral primary osteoarthritis, left knee: Secondary | ICD-10-CM | POA: Diagnosis not present

## 2018-01-13 DIAGNOSIS — Z1389 Encounter for screening for other disorder: Secondary | ICD-10-CM | POA: Diagnosis not present

## 2018-01-13 DIAGNOSIS — E7849 Other hyperlipidemia: Secondary | ICD-10-CM | POA: Diagnosis not present

## 2018-01-13 DIAGNOSIS — Z96652 Presence of left artificial knee joint: Secondary | ICD-10-CM | POA: Diagnosis not present

## 2018-01-13 DIAGNOSIS — I10 Essential (primary) hypertension: Secondary | ICD-10-CM | POA: Diagnosis not present

## 2018-01-13 DIAGNOSIS — Z0000 Encounter for general adult medical examination without abnormal findings: Secondary | ICD-10-CM | POA: Diagnosis not present

## 2018-01-22 DIAGNOSIS — Z96652 Presence of left artificial knee joint: Secondary | ICD-10-CM | POA: Diagnosis not present

## 2018-01-22 DIAGNOSIS — M1712 Unilateral primary osteoarthritis, left knee: Secondary | ICD-10-CM | POA: Diagnosis not present

## 2018-02-04 DIAGNOSIS — Z96652 Presence of left artificial knee joint: Secondary | ICD-10-CM | POA: Diagnosis not present

## 2018-02-04 DIAGNOSIS — M1712 Unilateral primary osteoarthritis, left knee: Secondary | ICD-10-CM | POA: Diagnosis not present

## 2018-08-24 ENCOUNTER — Encounter: Payer: Self-pay | Admitting: Internal Medicine

## 2018-10-16 DIAGNOSIS — Z4422 Encounter for fitting and adjustment of artificial left eye: Secondary | ICD-10-CM | POA: Diagnosis not present

## 2018-10-21 DIAGNOSIS — L570 Actinic keratosis: Secondary | ICD-10-CM | POA: Diagnosis not present

## 2018-10-21 DIAGNOSIS — Z85820 Personal history of malignant melanoma of skin: Secondary | ICD-10-CM | POA: Diagnosis not present

## 2018-10-21 DIAGNOSIS — X32XXXD Exposure to sunlight, subsequent encounter: Secondary | ICD-10-CM | POA: Diagnosis not present

## 2018-10-21 DIAGNOSIS — Z1283 Encounter for screening for malignant neoplasm of skin: Secondary | ICD-10-CM | POA: Diagnosis not present

## 2018-10-21 DIAGNOSIS — L821 Other seborrheic keratosis: Secondary | ICD-10-CM | POA: Diagnosis not present

## 2019-01-08 DIAGNOSIS — E7849 Other hyperlipidemia: Secondary | ICD-10-CM | POA: Diagnosis not present

## 2019-01-08 DIAGNOSIS — Z0000 Encounter for general adult medical examination without abnormal findings: Secondary | ICD-10-CM | POA: Diagnosis not present

## 2019-01-08 DIAGNOSIS — R82998 Other abnormal findings in urine: Secondary | ICD-10-CM | POA: Diagnosis not present

## 2019-01-15 DIAGNOSIS — Z0000 Encounter for general adult medical examination without abnormal findings: Secondary | ICD-10-CM | POA: Diagnosis not present

## 2019-03-02 ENCOUNTER — Encounter (HOSPITAL_COMMUNITY): Payer: Self-pay

## 2019-03-02 ENCOUNTER — Emergency Department (HOSPITAL_COMMUNITY)
Admission: EM | Admit: 2019-03-02 | Discharge: 2019-03-02 | Disposition: A | Discharge status: Home / Self Care | Payer: 59 | Attending: Emergency Medicine | Admitting: Emergency Medicine

## 2019-03-02 ENCOUNTER — Other Ambulatory Visit: Payer: Self-pay

## 2019-03-02 ENCOUNTER — Emergency Department (HOSPITAL_COMMUNITY): Payer: 59

## 2019-03-02 DIAGNOSIS — Y9389 Activity, other specified: Secondary | ICD-10-CM | POA: Insufficient documentation

## 2019-03-02 DIAGNOSIS — Z880 Allergy status to penicillin: Secondary | ICD-10-CM | POA: Diagnosis not present

## 2019-03-02 DIAGNOSIS — Z79899 Other long term (current) drug therapy: Secondary | ICD-10-CM | POA: Insufficient documentation

## 2019-03-02 DIAGNOSIS — Z7982 Long term (current) use of aspirin: Secondary | ICD-10-CM | POA: Diagnosis not present

## 2019-03-02 DIAGNOSIS — Y999 Unspecified external cause status: Secondary | ICD-10-CM | POA: Diagnosis not present

## 2019-03-02 DIAGNOSIS — I10 Essential (primary) hypertension: Secondary | ICD-10-CM | POA: Diagnosis not present

## 2019-03-02 DIAGNOSIS — S0101XA Laceration without foreign body of scalp, initial encounter: Secondary | ICD-10-CM | POA: Insufficient documentation

## 2019-03-02 DIAGNOSIS — Z87891 Personal history of nicotine dependence: Secondary | ICD-10-CM | POA: Diagnosis not present

## 2019-03-02 DIAGNOSIS — W208XXA Other cause of strike by thrown, projected or falling object, initial encounter: Secondary | ICD-10-CM | POA: Insufficient documentation

## 2019-03-02 DIAGNOSIS — Z96652 Presence of left artificial knee joint: Secondary | ICD-10-CM | POA: Diagnosis not present

## 2019-03-02 DIAGNOSIS — Y929 Unspecified place or not applicable: Secondary | ICD-10-CM | POA: Insufficient documentation

## 2019-03-02 NOTE — ED Notes (Signed)
Bed: WTR6 Expected date:  Expected time:  Means of arrival:  Comments: 

## 2019-03-02 NOTE — ED Provider Notes (Signed)
[facility] DEPT Provider Note   CSN: [id number] Arrival date & time: 03/02/19  2007     History   Chief Complaint Chief Complaint  Patient presents with   Head Laceration    HPI Bradley Baker is a 58 y.o. male.     The history is provided by the patient and medical records.     58 year old male with history of ADHD, skin cancer, arthritis, hypertension, presenting to the ED with a head injury.  He was helping male friend put together a weight set and was down on the floor screwing in a bottom screw when a barbell fell on his head.  He denies any loss of consciousness.  He has laceration to back of his scalp.  He denies any current dizziness, confusion, nausea, or vomiting.  He is not currently on anticoagulation.  His tetanus is up-to-date.  Past Medical History:  Diagnosis Date   ADHD (attention deficit hyperactivity disorder)    Arthritis    Cancer ([place])    skin cancers   History of kidney stones    Hypertension    Left knee DJD     Patient Active Problem List   Diagnosis Date Noted   Primary osteoarthritis of left knee 11/12/2017    Past Surgical History:  Procedure Laterality Date   CYST REMOVAL NECK     eye injury and loss  1987   TOTAL KNEE ARTHROPLASTY Left 11/12/2017   Procedure: TOTAL KNEE ARTHROPLASTY;  Surgeon: Melrose Nakayama, MD;  Location: [facility];  Service: Orthopedics;  Laterality: Left;   WRIST SURGERY Left 07/2017   temdon surgery        Home Medications    Prior to Admission medications   Medication Sig Start Date End Date Taking? Authorizing Provider  aspirin EC 325 MG EC tablet Take 1 tablet (325 mg total) by mouth 2 (two) times daily after a meal. 11/13/17   Loni Dolly, PA-C  atorvastatin (LIPITOR) 40 MG tablet Take 20 mg by mouth daily.    [provider]  bisacodyl (DULCOLAX) 5 MG EC tablet Take 1 tablet (5 mg total) by mouth daily as needed for moderate constipation. 11/13/17   Loni Dolly, PA-C  doxylamine, Sleep, (UNISOM) 25 MG tablet Take 25 mg by mouth at bedtime as needed for sleep.    [provider]  finasteride (PROPECIA) 1 MG tablet Take 0.25 mg by mouth daily.    [provider]  HYDROcodone-acetaminophen (NORCO) 7.5-325 MG tablet Take 1-2 tablets by mouth every 4 (four) hours as needed for severe pain (pain score 7-10). 11/13/17   Loni Dolly, PA-C  ibuprofen (ADVIL,MOTRIN) 200 MG tablet Take 400-800 mg by mouth daily as needed for moderate pain.    [provider]  losartan (COZAAR) 100 MG tablet Take 100 mg by mouth daily.    [provider]    Family History Family History  Problem Relation Age of Onset   Colon cancer Neg Hx    Pancreatic cancer Neg Hx    Stomach cancer Neg Hx     Social History Social History   Tobacco Use   Smoking status: Former Smoker    Quit date: 07/07/1999    Years since quitting: 19.6   Smokeless tobacco: Former Systems developer  Substance Use Topics   Alcohol use: Yes    Alcohol/week: 4.0 - 6.0 standard drinks    Types: 4 - 6 Cans of beer per week   Drug use: No  Allergies   Penicillins   Review of Systems Review of Systems  Skin: Positive for wound.  All other systems reviewed and are negative.    Physical Exam Updated Vital Signs BP (!) 181/104 (BP Location: Left Arm)    Pulse 86    Temp 98.8 F (37.1 C) (Oral)    Resp 18    Ht 6\' 3"  (1.905 m)    Wt 93.9 kg    SpO2 100%    BMI 25.87 kg/m   Physical Exam Vitals signs and nursing note reviewed.  Constitutional:      Appearance: He is well-developed.  HENT:     Head: Normocephalic and atraumatic.      Comments: 2 cm laceration to posterior scalp without any active bleeding, there is no associated hematoma or skull depression, remainder of head and face atraumatic Eyes:     Conjunctiva/sclera: Conjunctivae normal.     Pupils: Pupils are equal, round, and reactive to light.  Neck:     Musculoskeletal: Normal range  of motion.  Cardiovascular:     Rate and Rhythm: Normal rate and regular rhythm.     Heart sounds: Normal heart sounds.  Pulmonary:     Effort: Pulmonary effort is normal.     Breath sounds: Normal breath sounds.  Abdominal:     General: Bowel sounds are normal.     Palpations: Abdomen is soft.  Musculoskeletal: Normal range of motion.  Skin:    General: Skin is warm and dry.  Neurological:     Mental Status: He is alert and oriented to person, place, and time.     Comments: Awake, alert, appropriately oriented, laughing and making jokes during exam, moving all extremities well without issue, ambulatory with steady gait      ED Treatments / Results  Labs (all labs ordered are listed, but only abnormal results are displayed) Labs Reviewed - No data to display  EKG    Radiology Ct Head Wo Contrast  Result Date: 03/02/2019 CLINICAL DATA:  58 year old male status post blunt trauma, barbell fell on head. Laceration. EXAM: CT HEAD WITHOUT CONTRAST CT CERVICAL SPINE WITHOUT CONTRAST TECHNIQUE: Multidetector CT imaging of the head and cervical spine was performed following the standard protocol without intravenous contrast. Multiplanar CT image reconstructions of the cervical spine were also generated. COMPARISON:  None. FINDINGS: CT HEAD FINDINGS Brain: Small posterior fossa arachnoid cyst versus mega cisterna magna variant (normal variant sagittal image 33). Underlying cerebral volume within normal limits. No midline shift, ventriculomegaly, mass effect, intracranial hemorrhage or evidence of cortically based acute infarction. Gray-white matter differentiation is within normal limits throughout the brain. Vascular: Mild Calcified atherosclerosis at the skull base. No suspicious intracranial vascular hyperdensity. Skull: Chronic appearing left orbital floor fracture. No acute osseous abnormality identified. Sinuses/Orbits: Mild to moderate paranasal sinus mucosal thickening. Tympanic cavities  and mastoids are clear. Other: Left posterosuperior convexity scalp laceration and mild hematoma. Underlying calvarium intact. Negative scalp soft tissues elsewhere. Left orbit prosthesis. Negative right orbital soft tissues. CT CERVICAL SPINE FINDINGS Alignment: Straightening of cervical lordosis. Cervicothoracic junction alignment is within normal limits. Bilateral posterior element alignment is within normal limits. Skull base and vertebrae: Visualized skull base is intact. No atlanto-occipital dissociation. No acute osseous abnormality identified. Soft tissues and spinal canal: No prevertebral fluid or swelling. No visible canal hematoma. Negative noncontrast neck soft tissues aside from calcified carotid atherosclerosis. Disc levels: Ankylosis of C3-C4, and evidence of developing ankylosis of chronically degenerated left C4-C5 facets. Disc and endplate  degeneration elsewhere. Mild spinal stenosis suspected at C5-C6. Upper chest: Trace anterolisthesis of T1 on T2 with bilateral upper thoracic facet hypertrophy. Visible upper thoracic levels appear grossly intact. Negative lung apices. Negative noncontrast thoracic inlet. IMPRESSION: 1. Left scalp soft tissue injury without underlying fracture. 2. Negative non-contrast CT appearance of the brain. 3. No acute traumatic injury identified in the cervical spine. 4. Widespread cervical spine degeneration with some ankylosis. Mild degenerative spinal stenosis suspected at C5-C6. 5. Chronic left orbital floor fracture. Electronically Signed   By: Genevie Ann M.D.   On: 03/02/2019 21:16   Ct Cervical Spine Wo Contrast  Result Date: 03/02/2019 CLINICAL DATA:  58 year old male status post blunt trauma, barbell fell on head. Laceration. EXAM: CT HEAD WITHOUT CONTRAST CT CERVICAL SPINE WITHOUT CONTRAST TECHNIQUE: Multidetector CT imaging of the head and cervical spine was performed following the standard protocol without intravenous contrast. Multiplanar CT image  reconstructions of the cervical spine were also generated. COMPARISON:  None. FINDINGS: CT HEAD FINDINGS Brain: Small posterior fossa arachnoid cyst versus mega cisterna magna variant (normal variant sagittal image 33). Underlying cerebral volume within normal limits. No midline shift, ventriculomegaly, mass effect, intracranial hemorrhage or evidence of cortically based acute infarction. Gray-white matter differentiation is within normal limits throughout the brain. Vascular: Mild Calcified atherosclerosis at the skull base. No suspicious intracranial vascular hyperdensity. Skull: Chronic appearing left orbital floor fracture. No acute osseous abnormality identified. Sinuses/Orbits: Mild to moderate paranasal sinus mucosal thickening. Tympanic cavities and mastoids are clear. Other: Left posterosuperior convexity scalp laceration and mild hematoma. Underlying calvarium intact. Negative scalp soft tissues elsewhere. Left orbit prosthesis. Negative right orbital soft tissues. CT CERVICAL SPINE FINDINGS Alignment: Straightening of cervical lordosis. Cervicothoracic junction alignment is within normal limits. Bilateral posterior element alignment is within normal limits. Skull base and vertebrae: Visualized skull base is intact. No atlanto-occipital dissociation. No acute osseous abnormality identified. Soft tissues and spinal canal: No prevertebral fluid or swelling. No visible canal hematoma. Negative noncontrast neck soft tissues aside from calcified carotid atherosclerosis. Disc levels: Ankylosis of C3-C4, and evidence of developing ankylosis of chronically degenerated left C4-C5 facets. Disc and endplate degeneration elsewhere. Mild spinal stenosis suspected at C5-C6. Upper chest: Trace anterolisthesis of T1 on T2 with bilateral upper thoracic facet hypertrophy. Visible upper thoracic levels appear grossly intact. Negative lung apices. Negative noncontrast thoracic inlet. IMPRESSION: 1. Left scalp soft tissue  injury without underlying fracture. 2. Negative non-contrast CT appearance of the brain. 3. No acute traumatic injury identified in the cervical spine. 4. Widespread cervical spine degeneration with some ankylosis. Mild degenerative spinal stenosis suspected at C5-C6. 5. Chronic left orbital floor fracture. Electronically Signed   By: Genevie Ann M.D.   On: 03/02/2019 21:16    Procedures Procedures (including critical care time)  LACERATION REPAIR Performed by: Larene Pickett Authorized by: Larene Pickett Consent: Verbal consent obtained. Risks and benefits: risks, benefits and alternatives were discussed Consent given by: patient Patient identity confirmed: provided demographic data Prepped and Draped in normal sterile fashion Wound explored  Laceration Location: occipital scalp  Laceration Length: 2cm  No Foreign Bodies seen or palpated  Anesthesia: none  Local anesthetic:  none  Anesthetic total: 0 ml  Irrigation method: syringe Amount of cleaning: standard  Skin closure: staples  Number of staples: 2  Technique: n/a  Patient tolerance: Patient tolerated the procedure well with no immediate complications.   Medications Ordered in ED Medications - No data to display   Initial Impression / Assessment  and Plan / ED Course  I have reviewed the triage vital signs and the nursing notes.  Pertinent labs & imaging results that were available during my care of the patient were reviewed by me and considered in my medical decision making (see chart for details).  58 year old male here with head injury after barbell fell on his head.  There was no loss of consciousness.  He has a small laceration to posterior scalp without any active bleeding.  He is awake, alert, appropriately oriented here.  His neurologic exam is nonfocal.  His tetanus is up-to-date.  CT of the head and cervical spine obtained from triage and are reassuring.  Laceration repaired as above, patient tolerated  well.  Discussed home wound care.  Will need close follow-up with PCP for staple removal in about 1 week.  He can return here for any new or acute changes.  Final Clinical Impressions(s) / ED Diagnoses   Final diagnoses:  Laceration of scalp, initial encounter    ED Discharge Orders    None       Larene Pickett, PA-C 03/02/19 2308    Milton Ferguson, MD 03/03/19 [phone]

## 2019-03-02 NOTE — ED Triage Notes (Signed)
Pt was helping put a weight set together and a barbell fell on his head, he has a laceration to the top of his head, bleeding controlled at present

## 2019-03-02 NOTE — Discharge Instructions (Signed)
Head CT was normal. Staples need to stay in for about a week, primary care or urgent care can remove.  You can come back here if needed. Return here for any new/acute changes.

## 2019-07-05 HISTORY — PX: TOTAL KNEE ARTHROPLASTY: SHX125

## 2019-07-08 ENCOUNTER — Other Ambulatory Visit: Payer: Self-pay

## 2019-07-08 DIAGNOSIS — Z20822 Contact with and (suspected) exposure to covid-19: Secondary | ICD-10-CM

## 2019-07-09 LAB — NOVEL CORONAVIRUS, NAA: SARS-CoV-2, NAA: NOT DETECTED

## 2019-07-13 IMAGING — CR DG CHEST 2V
2 series · 2 of 2 positions shown · non-contrast
Comparison: Chest x-ray of 09/06/2004

CLINICAL DATA: Preop chest x-ray for total knee replacement,
history of hypertension

EXAM:
CHEST  2 VIEW

[w chest pa]
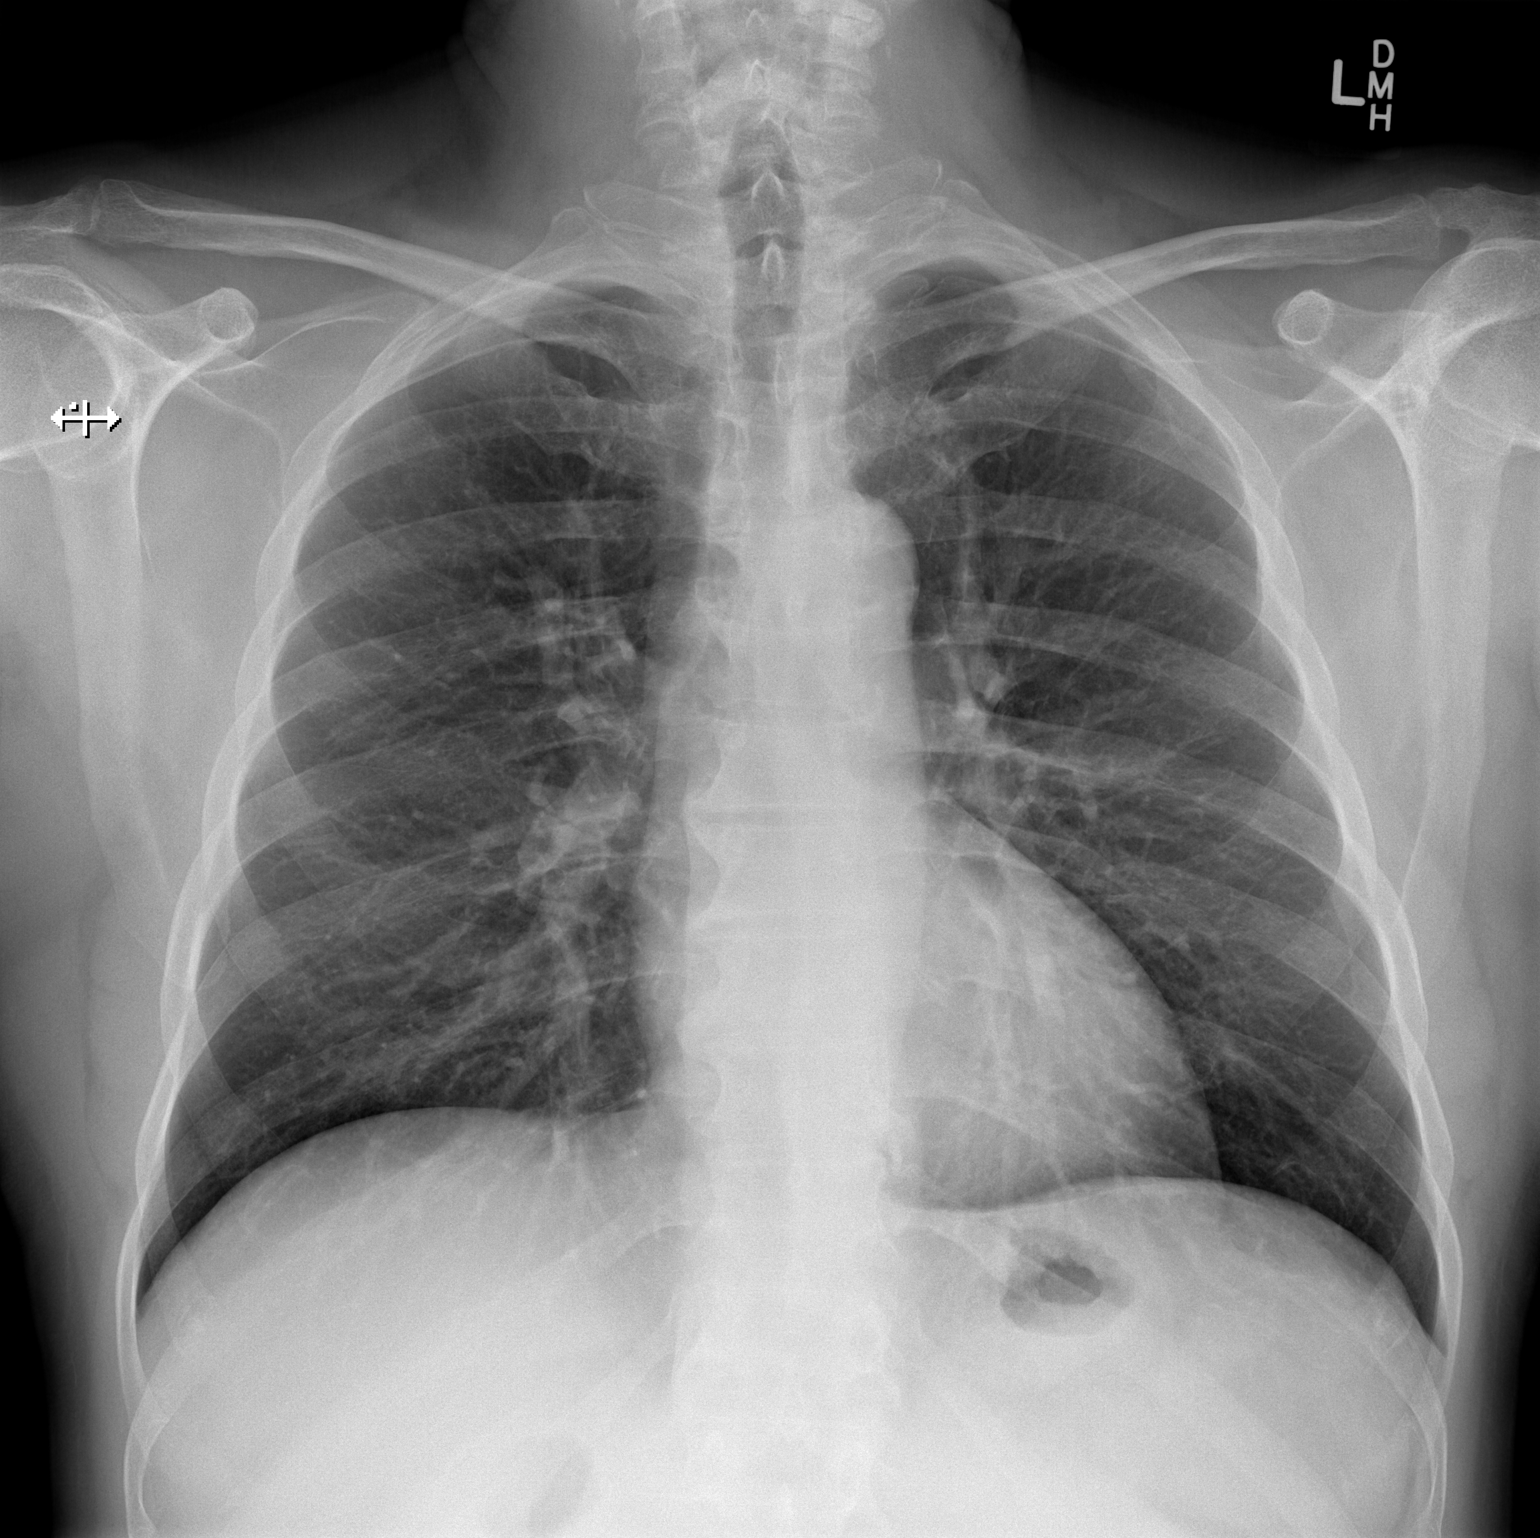

[w chest lat]
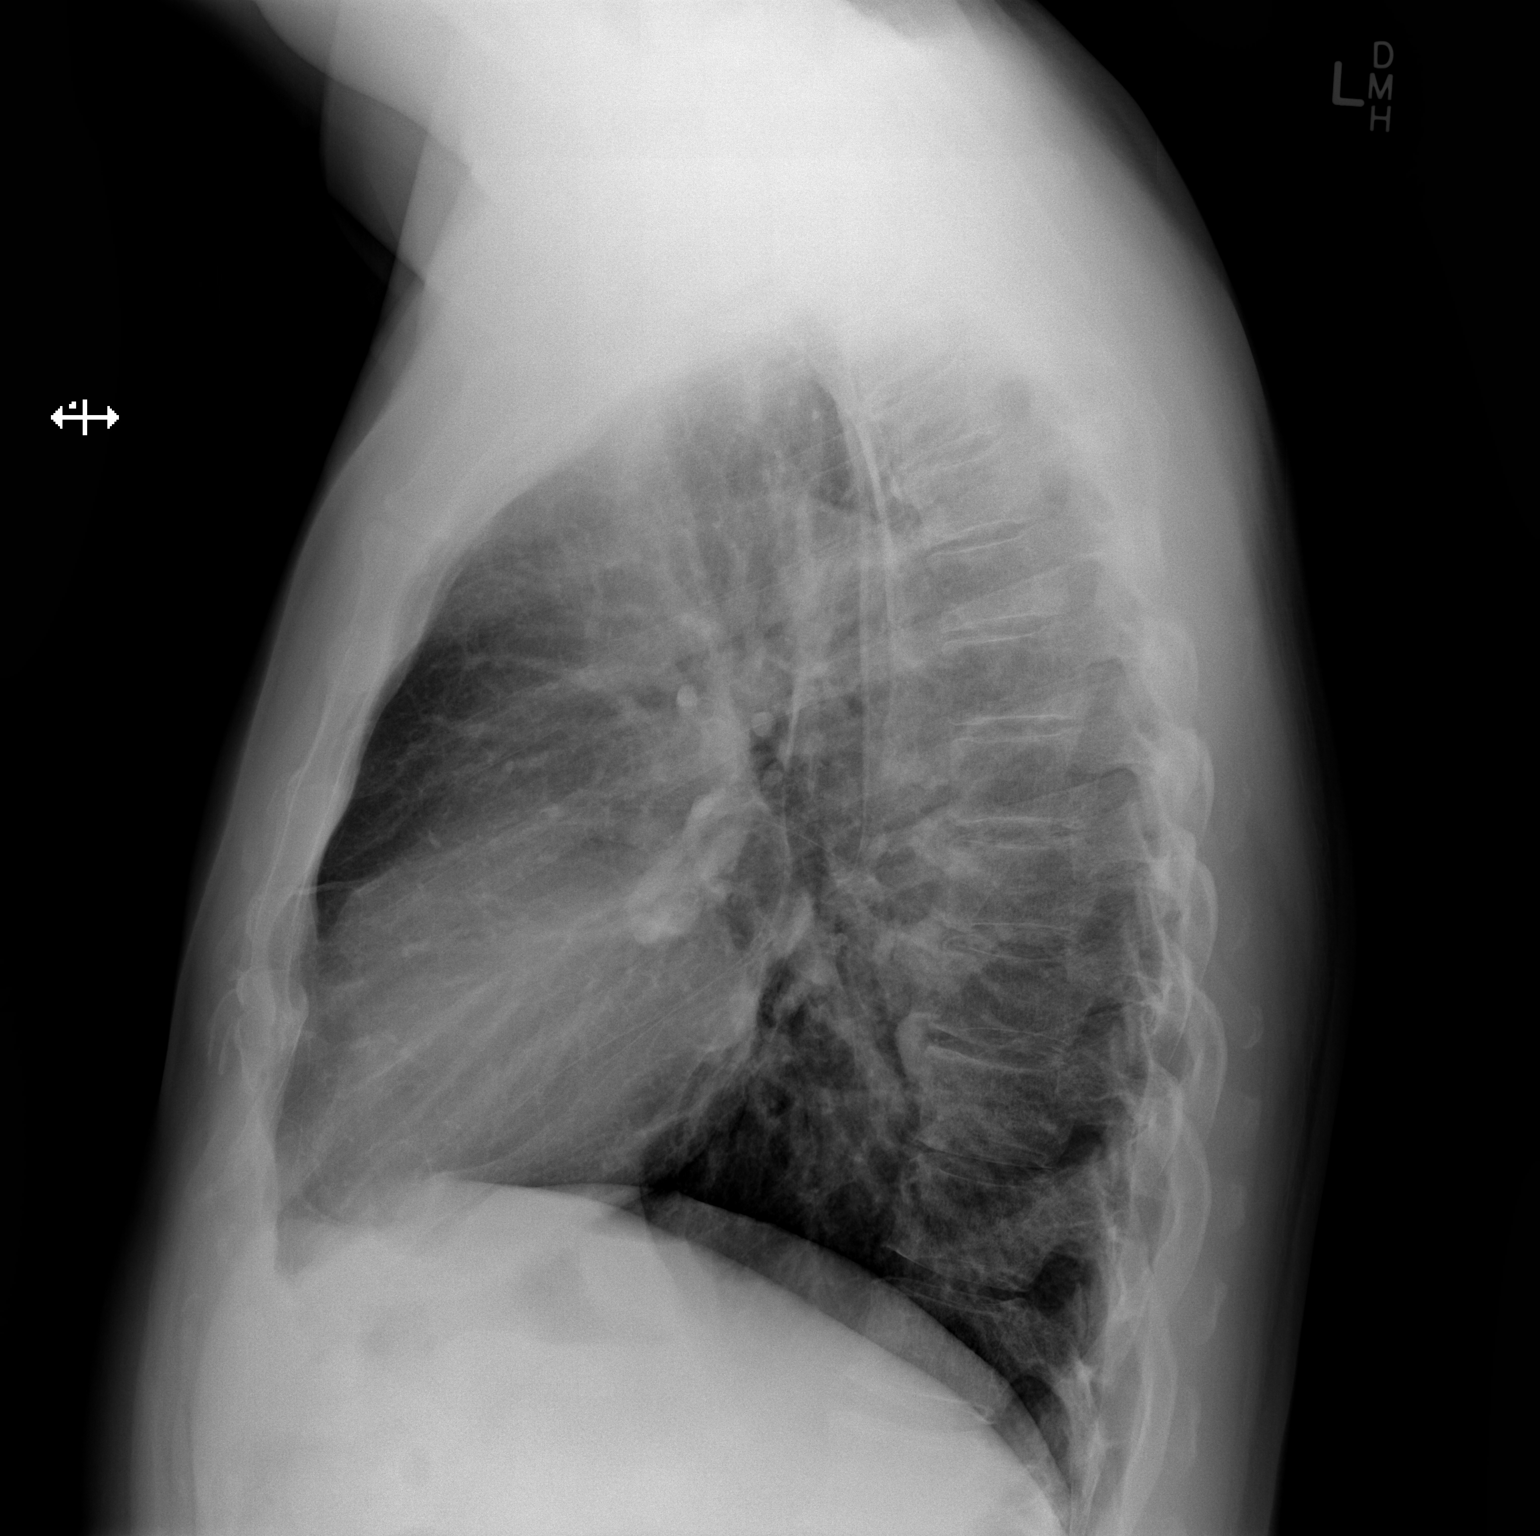

[2 of 2 positions shown; findings below may reference images not displayed]

FINDINGS: No active infiltrate or effusion is seen. Mediastinal and hilar
contours are unremarkable. The heart is within normal limits in
size. Mild degenerative changes noted in the mid to lower thoracic
spine.
IMPRESSION: No active cardiopulmonary disease.

## 2019-07-27 ENCOUNTER — Other Ambulatory Visit: Payer: Self-pay

## 2019-07-27 DIAGNOSIS — Z20822 Contact with and (suspected) exposure to covid-19: Secondary | ICD-10-CM

## 2019-07-28 LAB — NOVEL CORONAVIRUS, NAA: SARS-CoV-2, NAA: DETECTED — AB

## 2019-10-26 ENCOUNTER — Encounter: Payer: Self-pay | Admitting: Internal Medicine

## 2019-11-19 ENCOUNTER — Ambulatory Visit: Payer: Self-pay | Attending: Internal Medicine

## 2019-11-19 DIAGNOSIS — Z23 Encounter for immunization: Secondary | ICD-10-CM

## 2019-11-19 NOTE — Progress Notes (Signed)
   Covid-19 Vaccination Clinic  Name:  Bradley Baker    MRN: [id number] DOB: April 25, 1961  11/19/2019  [name] was observed post Covid-19 immunization for 15 minutes without incident. He was provided with Vaccine Information Sheet and instruction to access the V-Safe system.   [name] was instructed to call 911 with any severe reactions post vaccine: Marland Kitchen Difficulty breathing  . Swelling of face and throat  . A fast heartbeat  . A bad rash all over body  . Dizziness and weakness   Immunizations Administered    Name Date Dose VIS Date Route   Pfizer COVID-19 Vaccine 11/19/2019  3:46 PM 0.3 mL 08/14/2019 Intramuscular   Manufacturer: [place]   Lot: [id number]   [place]: [id number]

## 2019-12-03 ENCOUNTER — Ambulatory Visit (AMBULATORY_SURGERY_CENTER): Payer: Self-pay | Admitting: *Deleted

## 2019-12-03 ENCOUNTER — Other Ambulatory Visit: Payer: Self-pay

## 2019-12-03 VITALS — Temp 96.9°F | Ht 75.0 in | Wt 210.0 lb

## 2019-12-03 DIAGNOSIS — Z01818 Encounter for other preprocedural examination: Secondary | ICD-10-CM

## 2019-12-03 DIAGNOSIS — Z86010 Personal history of colonic polyps: Secondary | ICD-10-CM

## 2019-12-03 MED ORDER — SUPREP BOWEL PREP KIT 17.5-3.13-1.6 GM/177ML PO SOLN: 1.0000 | Freq: Once | ORAL | 0 refills | Status: AC

## 2019-12-03 NOTE — Progress Notes (Signed)
No egg or soy allergy known to patient  No issues with past sedation with any surgeries  or procedures, no intubation problems  No diet pills per patient No home 02 use per patient  No blood thinners per patient  Pt denies issues with constipation  No A fib or A flutter  EMMI video sent to pt's e mail   Due to the COVID-19 pandemic we are asking patients to follow these guidelines. Please only bring one care partner. Please be aware that your care partner may wait in the car in the parking lot or if they feel like they will be too hot to wait in the car, they may wait in the lobby on the 4th floor. All care partners are required to wear a mask the entire time (we do not have any that we can provide them), they need to practice social distancing, and we will do a Covid check for all patient's and care partners when you arrive. Also we will check their temperature and your temperature. If the care partner waits in their car they need to stay in the parking lot the entire time and we will call them on their cell phone when the patient is ready for discharge so they can bring the car to the front of the building. Also all patient's will need to wear a mask into building. Suprep code and coupon

## 2019-12-14 ENCOUNTER — Ambulatory Visit: Payer: No Typology Code available for payment source | Attending: Internal Medicine

## 2019-12-14 DIAGNOSIS — Z23 Encounter for immunization: Secondary | ICD-10-CM

## 2019-12-14 NOTE — Progress Notes (Signed)
   Covid-19 Vaccination Clinic  Name:  Juandavid Gonda    MRN: [id number] DOB: 08/30/1961  12/14/2019  Mr. Morning was observed post Covid-19 immunization for 15 minutes without incident. He was provided with Vaccine Information Sheet and instruction to access the V-Safe system.   Mr. Depaulis was instructed to call 911 with any severe reactions post vaccine: Marland Kitchen Difficulty breathing  . Swelling of face and throat  . A fast heartbeat  . A bad rash all over body  . Dizziness and weakness   Immunizations Administered    Name Date Dose VIS Date Route   Pfizer COVID-19 Vaccine 12/14/2019  4:49 PM 0.3 mL 08/14/2019 Intramuscular   Manufacturer: [place]   Lot: [id number]   [place]: [id number]

## 2019-12-15 ENCOUNTER — Ambulatory Visit (INDEPENDENT_AMBULATORY_CARE_PROVIDER_SITE_OTHER): Payer: No Typology Code available for payment source

## 2019-12-15 ENCOUNTER — Other Ambulatory Visit: Payer: Self-pay | Admitting: Internal Medicine

## 2019-12-15 DIAGNOSIS — Z1159 Encounter for screening for other viral diseases: Secondary | ICD-10-CM

## 2019-12-15 LAB — SARS CORONAVIRUS 2 (TAT 6-24 HRS): SARS Coronavirus 2: NEGATIVE

## 2019-12-17 ENCOUNTER — Encounter: Payer: Self-pay | Admitting: Internal Medicine

## 2019-12-17 ENCOUNTER — Ambulatory Visit (AMBULATORY_SURGERY_CENTER): Payer: No Typology Code available for payment source | Admitting: Internal Medicine

## 2019-12-17 ENCOUNTER — Other Ambulatory Visit: Payer: Self-pay

## 2019-12-17 VITALS — BP 145/94 | HR 72 | Temp 97.1°F | Resp 24 | Ht 75.0 in | Wt 210.0 lb

## 2019-12-17 DIAGNOSIS — Z86010 Personal history of colonic polyps: Secondary | ICD-10-CM | POA: Diagnosis present

## 2019-12-17 DIAGNOSIS — D122 Benign neoplasm of ascending colon: Secondary | ICD-10-CM | POA: Diagnosis not present

## 2019-12-17 DIAGNOSIS — D124 Benign neoplasm of descending colon: Secondary | ICD-10-CM

## 2019-12-17 MED ORDER — SODIUM CHLORIDE 0.9 % IV SOLN: 500.0000 mL | Freq: Once | INTRAVENOUS | Status: DC

## 2019-12-17 NOTE — Progress Notes (Signed)
Called to room to assist during endoscopic procedure.  Patient ID and intended procedure confirmed with present staff. Received instructions for my participation in the procedure from the performing physician.  

## 2019-12-17 NOTE — Progress Notes (Signed)
pt tolerated well. VSS. awake and to recovery. Report given to RN.  

## 2019-12-17 NOTE — Patient Instructions (Addendum)
YOU HAD AN ENDOSCOPIC PROCEDURE TODAY AT [facility]:   Refer to the procedure report that was given to you for any specific questions about what was found during the examination.  If the procedure report does not answer your questions, please call your gastroenterologist to clarify.  If you requested that your care partner not be given the details of your procedure findings, then the procedure report has been included in a sealed envelope for you to review at your convenience later.  YOU SHOULD EXPECT: Some feelings of bloating in the abdomen. Passage of more gas than usual.  Walking can help get rid of the air that was put into your GI tract during the procedure and reduce the bloating. If you had a lower endoscopy (such as a colonoscopy or flexible sigmoidoscopy) you may notice spotting of blood in your stool or on the toilet paper. If you underwent a bowel prep for your procedure, you may not have a normal bowel movement for a few days.  Please Note:  You might notice some irritation and congestion in your nose or some drainage.  This is from the oxygen used during your procedure.  There is no need for concern and it should clear up in a day or so.  SYMPTOMS TO REPORT IMMEDIATELY:   Following lower endoscopy (colonoscopy or flexible sigmoidoscopy):  Excessive amounts of blood in the stool  Significant tenderness or worsening of abdominal pains  Swelling of the abdomen that is new, acute  Fever of 100F or higher   For urgent or emergent issues, a gastroenterologist can be reached at any hour by calling [phone]. Do not use MyChart messaging for urgent concerns.    DIET:  We do recommend a small meal at first, but then you may proceed to your regular diet.  Drink plenty of fluids but you should avoid alcoholic beverages for 24 hours.  ACTIVITY:  You should plan to take it easy for the rest of today and you should NOT DRIVE or use heavy machinery until tomorrow (because  of the sedation medicines used during the test).    FOLLOW UP: Our staff will call the number listed on your records 48-72 hours following your procedure to check on you and address any questions or concerns that you may have regarding the information given to you following your procedure. If we do not reach you, we will leave a message.  We will attempt to reach you two times.  During this call, we will ask if you have developed any symptoms of COVID 19. If you develop any symptoms (ie: fever, flu-like symptoms, shortness of breath, cough etc.) before then, please call [phone].  If you test positive for Covid 19 in the 2 weeks post procedure, please call and report this information to [place].    If any biopsies were taken you will be contacted by phone or by letter within the next 1-3 weeks.  Please call us at [phone] if you have not heard about the biopsies in 3 weeks.    SIGNATURES/CONFIDENTIALITY: You and/or your care partner have signed paperwork which will be entered into your electronic medical record.  These signatures attest to the fact that that the information above on your After Visit Summary has been reviewed and is understood.  Full responsibility of the confidentiality of this discharge information lies with you and/or your care-partner.     Handouts were given to you on polyps and hemorrhoids. Repeat colonoscopy in 3 years  per Dr. Henrene Pastor. You may resume your current medications today. Await biopsy results. Please call if any questions or concerns.

## 2019-12-17 NOTE — Progress Notes (Addendum)
No problems noted in the recovery room. Maw   Pt has a swollen left upper eye lid.  I asked if he came in with that swelling.  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  maw

## 2019-12-17 NOTE — Progress Notes (Signed)
Pt. Reports no change in his medical or surgical history since his pre-visit 12/03/2019.

## 2019-12-17 NOTE — Op Note (Signed)
[place] Patient Name: Bradley Baker Procedure Date: 12/17/2019 9:53 AM MRN: [id number] Endoscopist: Docia Chuck. Henrene Pastor , MD Age: 59 Referring MD:  Date of Birth: 22-Jul-1961 Gender: Male Account #: [id number] Procedure:                Colonoscopy with cold snare polypectomy x 4 Indications:              High risk colon cancer surveillance: Personal                            history of adenoma (10 mm or greater in size), High                            risk colon cancer surveillance: Personal history of                            adenoma with villous component, High risk colon                            cancer surveillance: Personal history of multiple                            (3 or more) adenomas. Previous examinations 2010,                            2014 Medicines:                Monitored Anesthesia Care Procedure:                Pre-Anesthesia Assessment:                           - Prior to the procedure, a History and Physical                            was performed, and patient medications and                            allergies were reviewed. The patient's tolerance of                            previous anesthesia was also reviewed. The risks                            and benefits of the procedure and the sedation                            options and risks were discussed with the patient.                            All questions were answered, and informed consent                            was obtained. Prior Anticoagulants: The patient has  taken no previous anticoagulant or antiplatelet                            agents. ASA Grade Assessment: II - A patient with                            mild systemic disease. After reviewing the risks                            and benefits, the patient was deemed in                            satisfactory condition to undergo the procedure.                           After obtaining informed  consent, the colonoscope                            was passed under direct vision. Throughout the                            procedure, the patient's blood pressure, pulse, and                            oxygen saturations were monitored continuously. The                            Colonoscope was introduced through the anus and                            advanced to the the cecum, identified by                            appendiceal orifice and ileocecal valve. The                            ileocecal valve, appendiceal orifice, and rectum                            were photographed. The quality of the bowel                            preparation was excellent. The colonoscopy was                            performed without difficulty. The patient tolerated                            the procedure well. The bowel preparation used was                            SUPREP via split dose instruction. Scope In: 10:00:26 AM Scope Out: 10:16:29 AM Scope Withdrawal Time: 0 hours 10 minutes 21 seconds  Total Procedure Duration: 0  hours 16 minutes 3 seconds  Findings:                 Four polyps were found in the descending colon and                            ascending colon. The polyps were 2 to 5 mm in size.                            These polyps were removed with a cold snare.                            Resection and retrieval were complete.                           The exam was otherwise without abnormality on                            direct and retroflexion views. Complications:            No immediate complications. Estimated blood loss:                            None. Estimated Blood Loss:     Estimated blood loss: none. Impression:               - Four 2 to 5 mm polyps in the descending colon and                            in the ascending colon, removed with a cold snare.                            Resected and retrieved.                           - The examination was otherwise  normal on direct                            and retroflexion views. Recommendation:           - Repeat colonoscopy in 3 years for surveillance.                           - Patient has a contact number available for                            emergencies. The signs and symptoms of potential                            delayed complications were discussed with the                            patient. Return to normal activities tomorrow.                            Written discharge instructions were  provided to the                            patient.                           - Resume previous diet.                           - Continue present medications.                           - Await pathology results. Docia Chuck. Henrene Pastor, MD 12/17/2019 10:21:40 AM This report has been signed electronically.

## 2019-12-21 ENCOUNTER — Encounter: Payer: Self-pay | Admitting: Internal Medicine

## 2019-12-21 ENCOUNTER — Telehealth: Payer: Self-pay

## 2019-12-21 ENCOUNTER — Telehealth: Payer: Self-pay | Admitting: *Deleted

## 2019-12-21 NOTE — Telephone Encounter (Signed)
  Follow up Call-  Call back number 12/17/2019  Post procedure Call Back phone  # [phone]  Permission to leave phone message Yes  Some recent data might be hidden     Patient questions:  Do you have a fever, pain , or abdominal swelling? No. Pain Score  0 *  Have you tolerated food without any problems? Yes.    Have you been able to return to your normal activities? Yes.    Do you have any questions about your discharge instructions: Diet   No. Medications  No. Follow up visit  No.  Do you have questions or concerns about your Care? No.  Actions: * If pain score is 4 or above: No action needed, pain <4. 1. Have you developed a fever since your procedure? no  2.   Have you had an respiratory symptoms (SOB or cough) since your procedure? no  3.   Have you tested positive for COVID 19 since your procedure no  4.   Have you had any family members/close contacts diagnosed with the COVID 19 since your procedure?  no   If yes to any of these questions please route to Joylene John, RN and Erenest Rasher, RN

## 2019-12-21 NOTE — Telephone Encounter (Signed)
First attempt, left VM.  

## 2020-11-07 IMAGING — CT CT CERVICAL SPINE WITHOUT CONTRAST
4 of 7 series · 14 of 33 positions shown, 16 images · non-contrast
Comparison: None.

CLINICAL DATA: 58-year-old male status post blunt trauma, barbell
fell on head. Laceration.

EXAM:
CT HEAD WITHOUT CONTRAST
CT CERVICAL SPINE WITHOUT CONTRAST
TECHNIQUE: Multidetector CT imaging of the head and cervical spine was
performed following the standard protocol without intravenous
contrast. Multiplanar CT image reconstructions of the cervical spine
were also generated.

[Series 9: c spine soft · axial · 0.29mm/px · z∈[-313,-217]mm · 3 of 97 slices shown]
[im 25/97  soft-tissue]
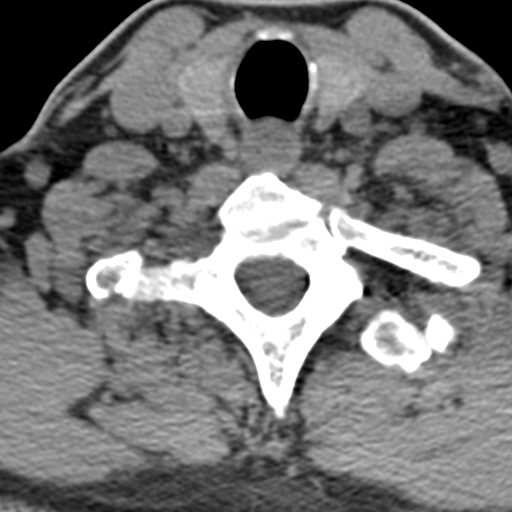
[im 49/97  soft-tissue]
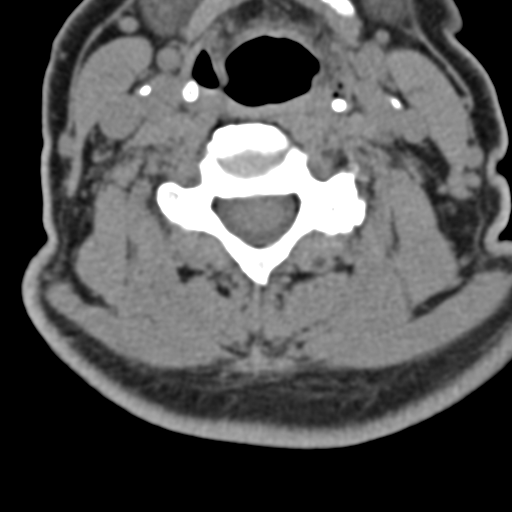
[im 73/97  soft-tissue]
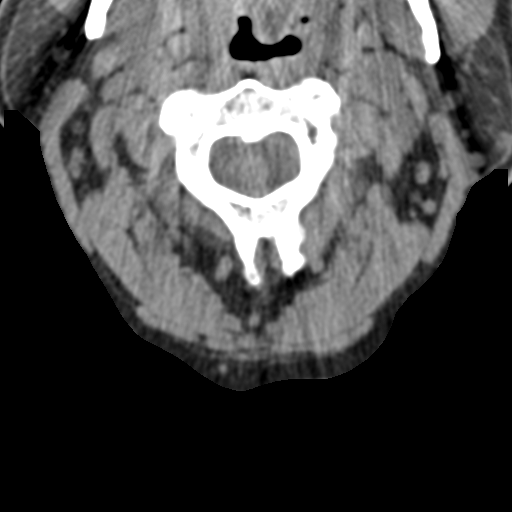

[Series 11: orthogonal bone · axial · 0.23mm/px · z∈[-366,-209]mm · 5 of 124 slices shown, 7 images]
[im 21/124  soft-tissue]
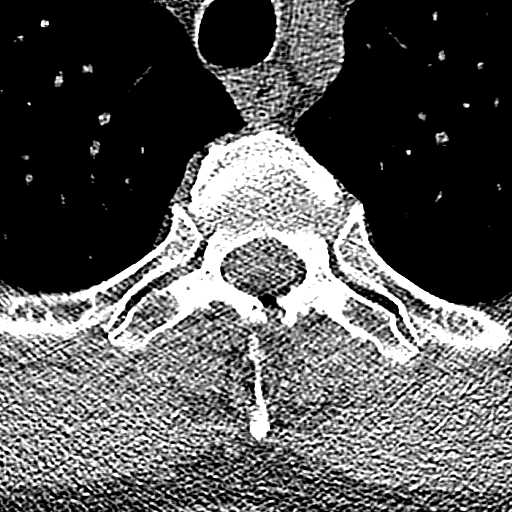
[im 21/124  bone]
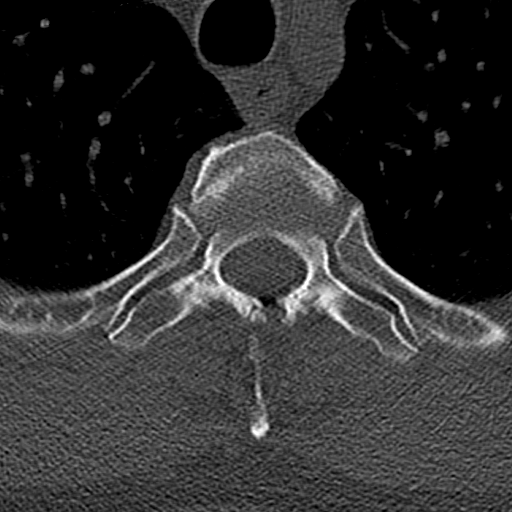
[im 42/124  bone]
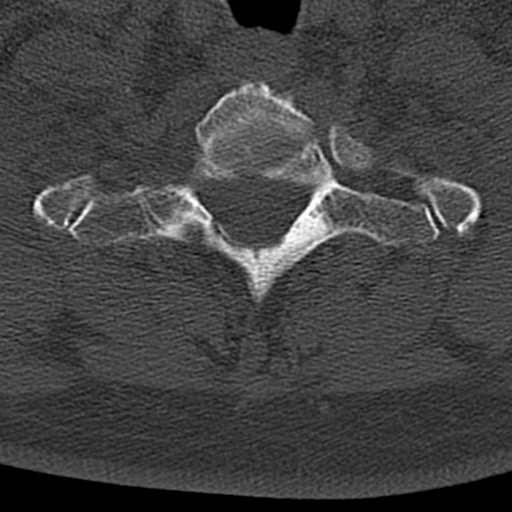
[im 62/124  bone]
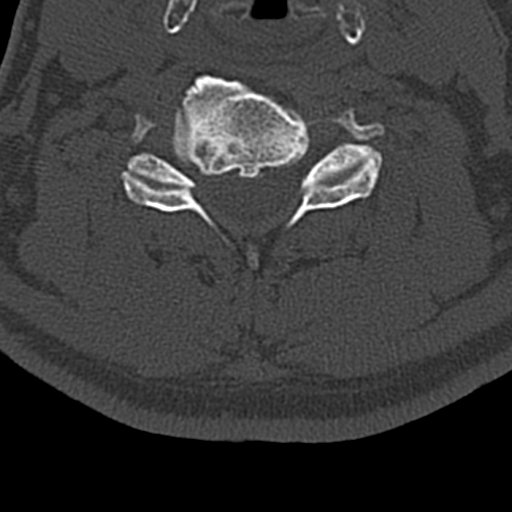
[im 83/124  bone]
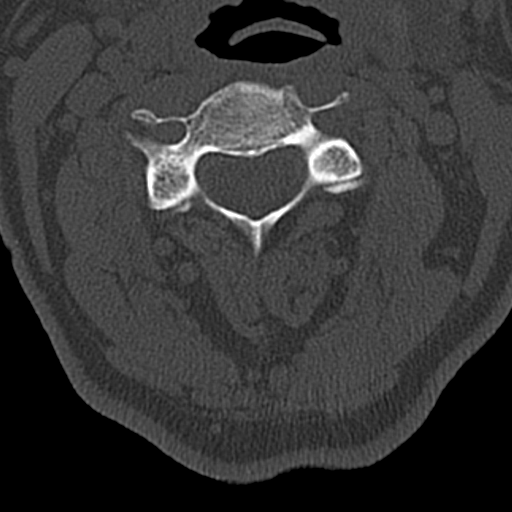
[im 103/124  soft-tissue]
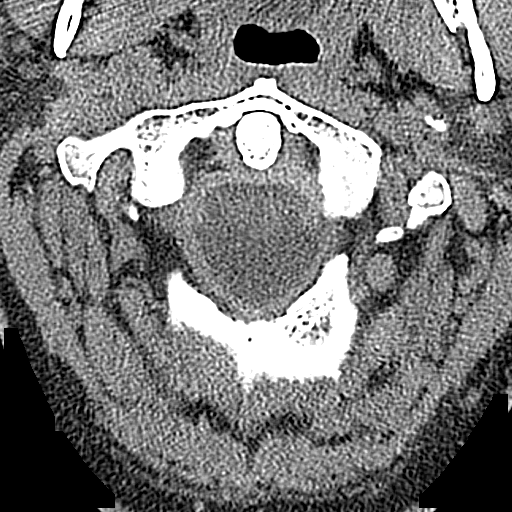
[im 103/124  bone]
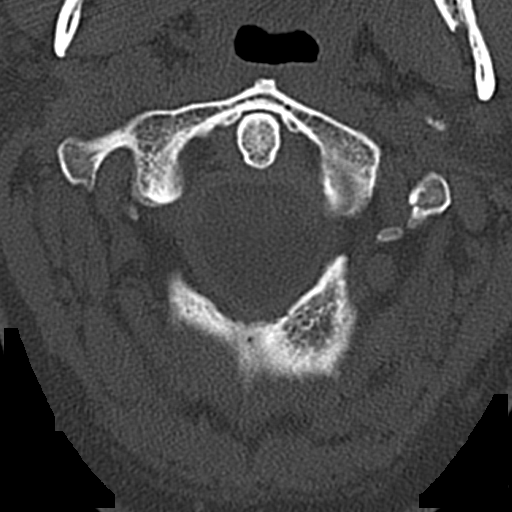

[Series 12: coronal bone · coronal · 0.23mm/px · 1 of 61 slices shown]
[im 31/61  bone]
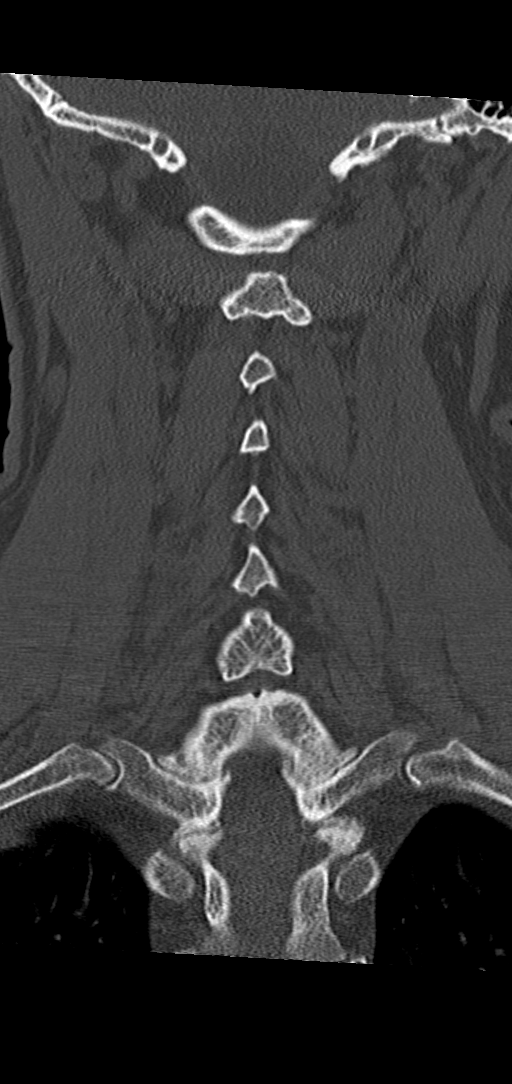

[Series 13: sagittal bone · sagittal · 0.39mm/px · 5 of 61 slices shown]
[im 11/61  bone]
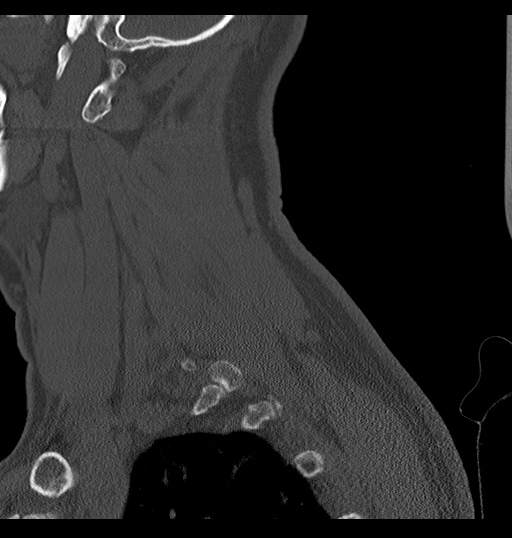
[im 21/61  bone]
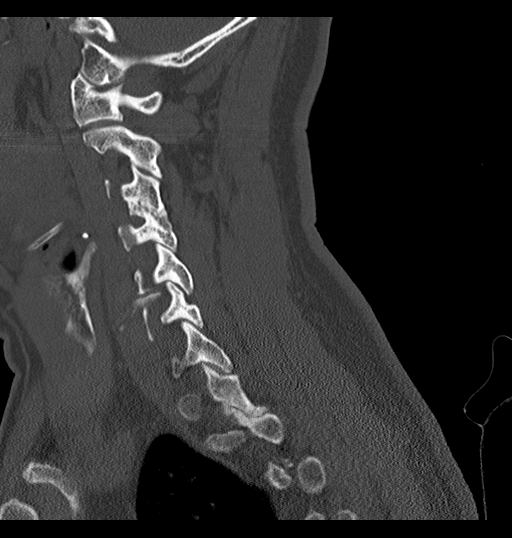
[im 31/61  bone]
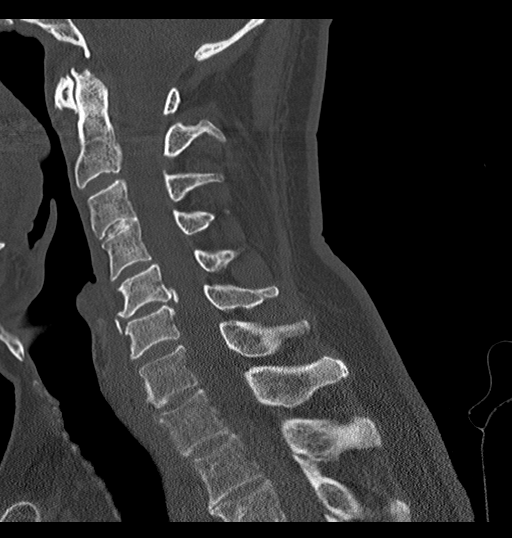
[im 41/61  bone]
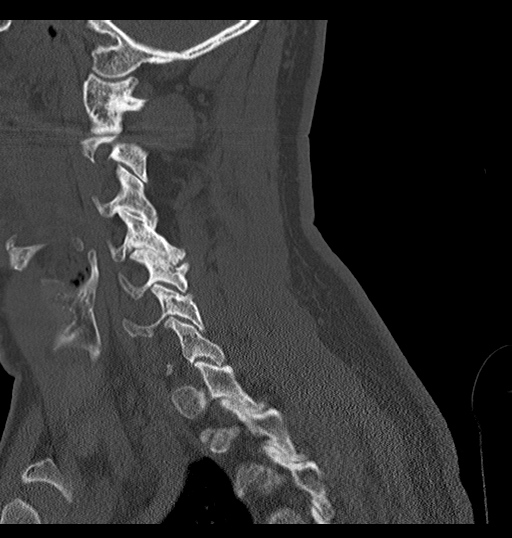
[im 51/61  bone]
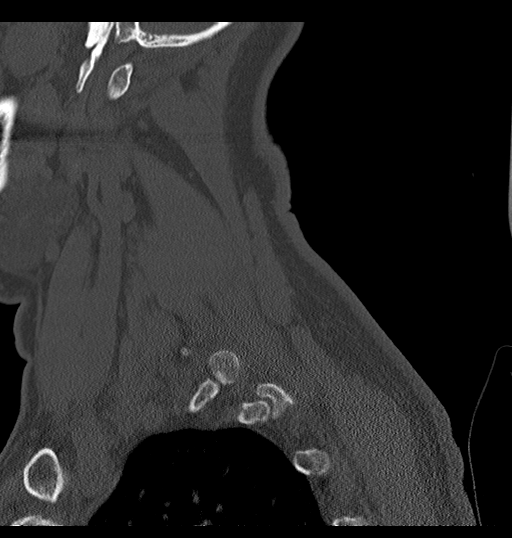

[14 of 33 positions shown; findings below may reference images not displayed]

FINDINGS: CT HEAD FINDINGS

Brain: Small posterior fossa arachnoid cyst versus mega cisterna
magna variant (normal variant sagittal image 33). Underlying
cerebral volume within normal limits. No midline shift,
ventriculomegaly, mass effect, intracranial hemorrhage or evidence
of cortically based acute infarction. Gray-white matter
differentiation is within normal limits throughout the brain.

Vascular: Mild Calcified atherosclerosis at the skull base. No
suspicious intracranial vascular hyperdensity.

Skull: Chronic appearing left orbital floor fracture. No acute
osseous abnormality identified.

Sinuses/Orbits: Mild to moderate paranasal sinus mucosal thickening.
Tympanic cavities and mastoids are clear.

Other: Left posterosuperior convexity scalp laceration and mild
hematoma. Underlying calvarium intact.

Negative scalp soft tissues elsewhere. Left orbit prosthesis.
Negative right orbital soft tissues.

CT CERVICAL SPINE FINDINGS

Alignment: Straightening of cervical lordosis. Cervicothoracic
junction alignment is within normal limits. Bilateral posterior
element alignment is within normal limits.

Skull base and vertebrae: Visualized skull base is intact. No
atlanto-occipital dissociation. No acute osseous abnormality
identified.

Soft tissues and spinal canal: No prevertebral fluid or swelling. No
visible canal hematoma. Negative noncontrast neck soft tissues aside
from calcified carotid atherosclerosis.

Disc levels: Ankylosis of C3-C4, and evidence of developing
ankylosis of chronically degenerated left C4-C5 facets. Disc and
endplate degeneration elsewhere. Mild spinal stenosis suspected at
C5-C6.

Upper chest: Trace anterolisthesis of T1 on T2 with bilateral upper
thoracic facet hypertrophy. Visible upper thoracic levels appear
grossly intact. Negative lung apices. Negative noncontrast thoracic
inlet.
IMPRESSION: 1. Left scalp soft tissue injury without underlying fracture.
2. Negative non-contrast CT appearance of the brain.
3. No acute traumatic injury identified in the cervical spine.
4. Widespread cervical spine degeneration with some ankylosis. Mild
degenerative spinal stenosis suspected at C5-C6.
5. Chronic left orbital floor fracture.

## 2023-01-02 ENCOUNTER — Encounter: Payer: Self-pay | Admitting: Internal Medicine

## 2023-02-21 ENCOUNTER — Encounter: Payer: Self-pay | Admitting: Internal Medicine

## 2023-03-28 ENCOUNTER — Ambulatory Visit (AMBULATORY_SURGERY_CENTER): Payer: 59 | Admitting: *Deleted

## 2023-03-28 VITALS — Ht 75.0 in | Wt 207.0 lb

## 2023-03-28 DIAGNOSIS — Z86010 Personal history of colonic polyps: Secondary | ICD-10-CM

## 2023-03-28 MED ORDER — NA SULFATE-K SULFATE-MG SULF 17.5-3.13-1.6 GM/177ML PO SOLN: 1.0000 | Freq: Once | ORAL | 0 refills | Status: AC

## 2023-03-28 NOTE — Progress Notes (Signed)
Pt's name and DOB verified at the beginning of the pre-visit.  Pt denies any difficulty with ambulating,sitting, laying down or rolling side to side Gave both [facility] main # and MD on call # prior to instructions.  No egg or soy allergy known to patient  No issues known to pt with past sedation with any surgeries or procedures Pt denies having issues being intubated Pt has no issues moving head neck or swallowing No FH of Malignant Hyperthermia Pt is not on diet pills Pt is not on home 02  Pt is not on blood thinners  Pt denies issues with constipation  Pt is not on dialysis Pt denise any abnormal heart rhythms  Pt denies any upcoming cardiac testing Pt encouraged to use to use Singlecare or Goodrx to reduce cost  Patient's chart reviewed by Cathlyn Parsons CNRA prior to pre-visit and patient appropriate for the [facility].  Pre-visit completed and red dot placed by patient's name on their procedure day (on provider's schedule).  . Visit by phone Pt states weight is 207 lb Instructed pt why it is important to and  to call if they have any changes in health or new medications. Directed them to the # given and on instructions.   Pt states they will.  Instructions reviewed with pt and pt states understanding. Instructed to review again prior to procedure. Pt states they will.  Instructions sent by mail with coupon and by my chart

## 2023-04-10 ENCOUNTER — Encounter: Payer: Self-pay | Admitting: Internal Medicine

## 2023-04-22 ENCOUNTER — Encounter: Payer: No Typology Code available for payment source | Admitting: Internal Medicine

## 2023-04-24 ENCOUNTER — Encounter: Payer: No Typology Code available for payment source | Admitting: Internal Medicine

## 2023-06-26 ENCOUNTER — Encounter: Payer: Self-pay | Admitting: Internal Medicine

## 2023-07-17 ENCOUNTER — Ambulatory Visit (AMBULATORY_SURGERY_CENTER): Payer: 59

## 2023-07-17 ENCOUNTER — Encounter: Payer: Self-pay | Admitting: Internal Medicine

## 2023-07-17 VITALS — Ht 75.0 in | Wt 205.0 lb

## 2023-07-17 DIAGNOSIS — Z860100 Personal history of colon polyps, unspecified: Secondary | ICD-10-CM

## 2023-07-17 MED ORDER — NA SULFATE-K SULFATE-MG SULF 17.5-3.13-1.6 GM/177ML PO SOLN
1.0000 | Freq: Once | ORAL | 0 refills | Status: AC
Start: 2023-07-17 — End: 2023-07-17

## 2023-07-17 NOTE — Progress Notes (Signed)

## 2023-07-25 ENCOUNTER — Ambulatory Visit: Payer: 59 | Admitting: Internal Medicine

## 2023-07-25 ENCOUNTER — Encounter: Payer: Self-pay | Admitting: Internal Medicine

## 2023-07-25 VITALS — BP 173/100 | HR 72 | Temp 97.1°F | Resp 18 | Ht 75.0 in | Wt 207.0 lb

## 2023-07-25 DIAGNOSIS — D122 Benign neoplasm of ascending colon: Secondary | ICD-10-CM

## 2023-07-25 DIAGNOSIS — Z860101 Personal history of adenomatous and serrated colon polyps: Secondary | ICD-10-CM

## 2023-07-25 DIAGNOSIS — Z1211 Encounter for screening for malignant neoplasm of colon: Secondary | ICD-10-CM

## 2023-07-25 DIAGNOSIS — Z860100 Personal history of colon polyps, unspecified: Secondary | ICD-10-CM

## 2023-07-25 MED ORDER — SODIUM CHLORIDE 0.9 % IV SOLN: 500.0000 mL | INTRAVENOUS | Status: DC

## 2023-07-25 NOTE — Progress Notes (Signed)
Pt's states no medical or surgical changes since previsit or office visit. 

## 2023-07-25 NOTE — Patient Instructions (Addendum)
Resume previous diet Continue present medications Await pathology results Repeat colonoscopy in 5 years  Handouts/information given for polyps  YOU HAD AN ENDOSCOPIC PROCEDURE TODAY AT THE [facility]:   Refer to the procedure report that was given to you for any specific questions about what was found during the examination.  If the procedure report does not answer your questions, please call your gastroenterologist to clarify.  If you requested that your care partner not be given the details of your procedure findings, then the procedure report has been included in a sealed envelope for you to review at your convenience later.  YOU SHOULD EXPECT: Some feelings of bloating in the abdomen. Passage of more gas than usual.  Walking can help get rid of the air that was put into your GI tract during the procedure and reduce the bloating. If you had a lower endoscopy (such as a colonoscopy or flexible sigmoidoscopy) you may notice spotting of blood in your stool or on the toilet paper. If you underwent a bowel prep for your procedure, you may not have a normal bowel movement for a few days.  Please Note:  You might notice some irritation and congestion in your nose or some drainage.  This is from the oxygen used during your procedure.  There is no need for concern and it should clear up in a day or so.  SYMPTOMS TO REPORT IMMEDIATELY:  Following lower endoscopy (colonoscopy):  Excessive amounts of blood in the stool  Significant tenderness or worsening of abdominal pains  Swelling of the abdomen that is new, acute  Fever of 100F or higher  For urgent or emergent issues, a gastroenterologist can be reached at any hour by calling [phone]. Do not use MyChart messaging for urgent concerns.    DIET:  We do recommend a small meal at first, but then you may proceed to your regular diet.  Drink plenty of fluids but you should avoid alcoholic beverages for 24 hours.  ACTIVITY:  You  should plan to take it easy for the rest of today and you should NOT DRIVE or use heavy machinery until tomorrow (because of the sedation medicines used during the test).    FOLLOW UP: Our staff will call the number listed on your records the next business day following your procedure.  We will call around 7:15- 8:00 am to check on you and address any questions or concerns that you may have regarding the information given to you following your procedure. If we do not reach you, we will leave a message.     If any biopsies were taken you will be contacted by phone or by letter within the next 1-3 weeks.  Please call us at [phone] if you have not heard about the biopsies in 3 weeks.   SIGNATURES/CONFIDENTIALITY: You and/or your care partner have signed paperwork which will be entered into your electronic medical record.  These signatures attest to the fact that that the information above on your After Visit Summary has been reviewed and is understood.  Full responsibility of the confidentiality of this discharge information lies with you and/or your care-partner.

## 2023-07-25 NOTE — Progress Notes (Signed)
Called to room to assist during endoscopic procedure.  Patient ID and intended procedure confirmed with present staff. Received instructions for my participation in the procedure from the performing physician.  

## 2023-07-25 NOTE — Progress Notes (Signed)
HISTORY OF PRESENT ILLNESS:  Bradley Baker is a 62 y.o. male with a history of multiple advanced adenomatous colon polyps.  Presents today for surveillance colonoscopy.  No complaints.  REVIEW OF SYSTEMS:  All non-GI ROS negative except for  Past Medical History:  Diagnosis Date   ADHD (attention deficit hyperactivity disorder)    Allergy    seasonal    Arthritis    Cancer (HCC)    skin cancers   History of kidney stones    Hyperlipidemia    Hypertension    Left knee DJD     Past Surgical History:  Procedure Laterality Date   COLONOSCOPY     CYST REMOVAL NECK     eye injury and loss  1987   POLYPECTOMY     TOTAL KNEE ARTHROPLASTY Left 11/12/2017   Procedure: TOTAL KNEE ARTHROPLASTY;  Surgeon: Marcene Corning, MD;  Location: [facility] OR;  Service: Orthopedics;  Laterality: Left;   TOTAL KNEE ARTHROPLASTY Right 07/2019   WRIST SURGERY Left 07/2017   temdon surgery    Social History Bradley Baker  reports that he quit smoking about 24 years ago. His smoking use included cigarettes. He has quit using smokeless tobacco. He reports current alcohol use of about 4.0 - 6.0 standard drinks of alcohol per week. He reports that he does not use drugs.  family history is not on file.  Allergies  Allergen Reactions   Penicillins Hives and Nausea And Vomiting    Has patient had a PCN reaction causing immediate rash, facial/tongue/throat swelling, SOB or lightheadedness with hypotension: Unknown Has patient had a PCN reaction causing severe rash involving mucus membranes or skin necrosis: Yes Has patient had a PCN reaction that required hospitalization: No Has patient had a PCN reaction occurring within the last 10 years: No If all of the above answers are "NO", then may proceed with Cephalosporin use.        PHYSICAL EXAMINATION: Vital signs: BP (!) 226/117   Pulse 92   Temp (!) 97.1 F (36.2 C)   Resp 14   Ht 6\' 3"  (1.905 m)   Wt 207 lb (93.9 kg)   SpO2 100%   BMI 25.87 kg/m   General: Well-developed, well-nourished, no acute distress HEENT: Sclerae are anicteric, conjunctiva pink. Oral mucosa intact Lungs: Clear Heart: Regular Abdomen: soft, nontender, nondistended, no obvious ascites, no peritoneal signs, normal bowel sounds. No organomegaly. Extremities: No edema Psychiatric: alert and oriented x3. Cooperative     ASSESSMENT:  History of multiple advanced adenomatous colon polyps   PLAN:  Surveillance colonoscopy

## 2023-07-25 NOTE — Op Note (Signed)
[facility] Patient Name: Bradley Baker Procedure Date: 07/25/2023 3:07 PM MRN: [id number] Endoscopist: Wilhemina Bonito. Marina Goodell , MD, [id number] Age: 62 Referring MD:  Date of Birth: 11-26-1960 Gender: Male Account #: [id number] Procedure:                Colonoscopy with cold snare polypectomy x 1 Indications:              High risk colon cancer surveillance: Personal                            history of adenoma (10 mm or greater in size), High                            risk colon cancer surveillance: Personal history of                            multiple (3 or more) adenomas. Previous                            examinations 2010, 2014, 2021 Medicines:                Monitored Anesthesia Care Procedure:                Pre-Anesthesia Assessment:                           - Prior to the procedure, a History and Physical                            was performed, and patient medications and                            allergies were reviewed. The patient's tolerance of                            previous anesthesia was also reviewed. The risks                            and benefits of the procedure and the sedation                            options and risks were discussed with the patient.                            All questions were answered, and informed consent                            was obtained. Prior Anticoagulants: The patient has                            taken no anticoagulant or antiplatelet agents.                            After reviewing the risks and benefits, the patient  was deemed in satisfactory condition to undergo the                            procedure.                           After obtaining informed consent, the colonoscope                            was passed under direct vision. Throughout the                            procedure, the patient's blood pressure, pulse, and                            oxygen saturations were  monitored continuously. The                            Olympus Scope SN: [id number] was introduced through                            the anus and advanced to the the cecum, identified                            by appendiceal orifice and ileocecal valve. The                            ileocecal valve, appendiceal orifice, and rectum                            were photographed. The quality of the bowel                            preparation was excellent. The colonoscopy was                            performed without difficulty. The patient tolerated                            the procedure well. The bowel preparation used was                            SUPREP via split dose instruction. Scope In: 3:19:15 PM Scope Out: 3:35:54 PM Scope Withdrawal Time: 0 hours 13 minutes 14 seconds  Total Procedure Duration: 0 hours 16 minutes 39 seconds  Findings:                 A 4 mm polyp was found in the ascending colon. The                            polyp was removed with a cold snare. Resection and                            retrieval were complete.  The exam was otherwise without abnormality on                            direct and retroflexion views. Complications:            No immediate complications. Estimated blood loss:                            None. Estimated Blood Loss:     Estimated blood loss: none. Impression:               - One 4 mm polyp in the ascending colon, removed                            with a cold snare. Resected and retrieved.                           - The examination was otherwise normal on direct                            and retroflexion views. Recommendation:           - Repeat colonoscopy in 5 years for surveillance                            (personal history of multiple and advanced                            adenomas).                           - Patient has a contact number available for                            emergencies. The  signs and symptoms of potential                            delayed complications were discussed with the                            patient. Return to normal activities tomorrow.                            Written discharge instructions were provided to the                            patient.                           - Resume previous diet.                           - Continue present medications.                           - Await pathology results. Wilhemina Bonito. Marina Goodell, MD 07/25/2023 3:40:20 PM This report has been signed electronically.

## 2023-07-25 NOTE — Progress Notes (Signed)
Report to PACU, RN, vss, BBS= Clear.  

## 2023-07-26 ENCOUNTER — Telehealth: Payer: Self-pay

## 2023-07-26 NOTE — Telephone Encounter (Signed)
  Follow up Call-     07/25/2023    2:49 PM  Call back number  Post procedure Call Back phone  # [phone]  Permission to leave phone message Yes     Patient questions:  Do you have a fever, pain , or abdominal swelling? No. Pain Score  0 *  Have you tolerated food without any problems? Yes.    Have you been able to return to your normal activities? Yes.    Do you have any questions about your discharge instructions: Diet   No. Medications  No. Follow up visit  No.  Do you have questions or concerns about your Care? No.  Actions: * If pain score is 4 or above: No action needed, pain <4.

## 2023-07-30 ENCOUNTER — Encounter: Payer: Self-pay | Admitting: Internal Medicine

## 2023-07-30 LAB — SURGICAL PATHOLOGY
